# Patient Record
Sex: Female | Born: 1945 | Race: White | Hispanic: No | Marital: Married | Smoking: Current some day smoker
Health system: Southern US, Community
[De-identification: ages and names within clinical notes are randomized; demographics above are authoritative.]

## PROBLEM LIST (undated history)

## (undated) DIAGNOSIS — I10 Essential (primary) hypertension: Secondary | ICD-10-CM

## (undated) DIAGNOSIS — J449 Chronic obstructive pulmonary disease, unspecified: Secondary | ICD-10-CM

## (undated) HISTORY — PX: TUBAL LIGATION: SHX77

---

## 2011-04-10 ENCOUNTER — Emergency Department (HOSPITAL_BASED_OUTPATIENT_CLINIC_OR_DEPARTMENT_OTHER)
Admission: EM | Admit: 2011-04-10 | Discharge: 2011-04-10 | Disposition: A | Discharge status: Home / Self Care | Payer: Medicare Other | Attending: Emergency Medicine | Admitting: Emergency Medicine

## 2011-04-10 ENCOUNTER — Emergency Department (INDEPENDENT_AMBULATORY_CARE_PROVIDER_SITE_OTHER): Payer: Medicare Other

## 2011-04-10 ENCOUNTER — Encounter: Payer: Self-pay | Admitting: *Deleted

## 2011-04-10 DIAGNOSIS — R51 Headache: Secondary | ICD-10-CM | POA: Insufficient documentation

## 2011-04-10 DIAGNOSIS — S1093XA Contusion of unspecified part of neck, initial encounter: Secondary | ICD-10-CM

## 2011-04-10 DIAGNOSIS — S0510XA Contusion of eyeball and orbital tissues, unspecified eye, initial encounter: Secondary | ICD-10-CM

## 2011-04-10 DIAGNOSIS — S0010XA Contusion of unspecified eyelid and periocular area, initial encounter: Secondary | ICD-10-CM | POA: Insufficient documentation

## 2011-04-10 DIAGNOSIS — W1809XA Striking against other object with subsequent fall, initial encounter: Secondary | ICD-10-CM | POA: Insufficient documentation

## 2011-04-10 DIAGNOSIS — R220 Localized swelling, mass and lump, head: Secondary | ICD-10-CM

## 2011-04-10 DIAGNOSIS — Y92009 Unspecified place in unspecified non-institutional (private) residence as the place of occurrence of the external cause: Secondary | ICD-10-CM | POA: Insufficient documentation

## 2011-04-10 DIAGNOSIS — I10 Essential (primary) hypertension: Secondary | ICD-10-CM | POA: Insufficient documentation

## 2011-04-10 DIAGNOSIS — W19XXXA Unspecified fall, initial encounter: Secondary | ICD-10-CM

## 2011-04-10 HISTORY — DX: Essential (primary) hypertension: I10

## 2011-04-10 NOTE — ED Notes (Signed)
Patient states that she took [place] to sleep but got up to get something to eat and tripped over some pillows and fell into a door, hitting L side of face, dazed but did no LOC, swelling and bruised around L eye

## 2011-04-10 NOTE — ED Provider Notes (Signed)
History     CSN: [id number] Arrival date & time: 04/10/2011  1:29 PM   First MD Initiated Contact with Patient 04/10/11 1339      Chief Complaint  Patient presents with  . Fall    (Consider location/radiation/quality/duration/timing/severity/associated sxs/prior treatment) HPI  Patient states that she took [place] to sleep but got up to get something to eat and tripped over some pillows and fell into a door, hitting L side of face, dazed but did no LOC, swelling and bruised around L eye.  Patient denies double vision.  Has had occasional headache off and on since the event.  Past Medical History  Diagnosis Date  . Hypertension     History reviewed. No pertinent past surgical history.  No family history on file.  History  Substance Use Topics  . Smoking status: Current Some Day Smoker  . Smokeless tobacco: Not on file  . Alcohol Use: Yes    OB History    Grav Para Term Preterm Abortions TAB SAB Ect Mult Living                  Review of Systems Remainder review of systems is negative except where noted in history of present illness Allergies  Macrodantin and Sulfa antibiotics  Home Medications   Current Outpatient Rx  Name Route Sig Dispense Refill  . XANAX PO Oral Take by mouth.      . AMLODIPINE BESYLATE 5 MG PO TABS Oral Take by mouth daily.      . ASPIRIN 81 MG PO TABS Oral Take 81 mg by mouth daily.      Marland Kitchen LOXITANE PO Oral Take by mouth.      . MULTIVITAMINS PO CAPS Oral Take 1 capsule by mouth daily.      Marland Kitchen ZOLOFT PO Oral Take 200 mg by mouth.      . DIOVAN HCT PO Oral Take by mouth.      . AMBIEN PO Oral Take by mouth.        BP 135/85  Pulse 94  Temp(Src) 98.2 F (36.8 C) (Oral)  Resp 20  SpO2 97%  Physical Exam  Nursing note and vitals reviewed. Constitutional: She is oriented to person, place, and time. She appears well-developed and well-nourished. No distress.  HENT:  Head: Normocephalic and atraumatic.  Eyes: Pupils are equal, round,  and reactive to light.       Patient has periorbital ecchymosis and hematoma present extraocular movements intact and normal.  Pupils equal reactive.  No hyphema.  Neck: Normal range of motion.  Cardiovascular: Normal rate and intact distal pulses.   Pulmonary/Chest: No respiratory distress.  Abdominal: Normal appearance. She exhibits no distension.  Musculoskeletal: Normal range of motion.  Neurological: She is alert and oriented to person, place, and time. No cranial nerve deficit.  Skin: Skin is warm and dry. No rash noted.  Psychiatric: She has a normal mood and affect. Her behavior is normal.    ED Course  Procedures (including critical care time)  Labs Reviewed - No data to display No results found.   1. Fall   2. Periorbital contusion       MDM          Nelia Shi, MD 04/10/11 2158

## 2012-06-12 ENCOUNTER — Other Ambulatory Visit: Payer: Self-pay | Admitting: Neurological Surgery

## 2012-06-12 DIAGNOSIS — M549 Dorsalgia, unspecified: Secondary | ICD-10-CM

## 2012-07-01 ENCOUNTER — Emergency Department (HOSPITAL_BASED_OUTPATIENT_CLINIC_OR_DEPARTMENT_OTHER): Payer: Medicare Other

## 2012-07-01 ENCOUNTER — Encounter (HOSPITAL_BASED_OUTPATIENT_CLINIC_OR_DEPARTMENT_OTHER): Payer: Self-pay

## 2012-07-01 ENCOUNTER — Emergency Department (HOSPITAL_BASED_OUTPATIENT_CLINIC_OR_DEPARTMENT_OTHER)
Admission: EM | Admit: 2012-07-01 | Discharge: 2012-07-01 | Disposition: A | Discharge status: Home / Self Care | Payer: Medicare Other | Attending: Emergency Medicine | Admitting: Emergency Medicine

## 2012-07-01 DIAGNOSIS — Y939 Activity, unspecified: Secondary | ICD-10-CM | POA: Insufficient documentation

## 2012-07-01 DIAGNOSIS — S20219A Contusion of unspecified front wall of thorax, initial encounter: Secondary | ICD-10-CM | POA: Insufficient documentation

## 2012-07-01 DIAGNOSIS — W1809XA Striking against other object with subsequent fall, initial encounter: Secondary | ICD-10-CM | POA: Insufficient documentation

## 2012-07-01 DIAGNOSIS — S20212A Contusion of left front wall of thorax, initial encounter: Secondary | ICD-10-CM

## 2012-07-01 DIAGNOSIS — F17200 Nicotine dependence, unspecified, uncomplicated: Secondary | ICD-10-CM | POA: Insufficient documentation

## 2012-07-01 DIAGNOSIS — W010XXA Fall on same level from slipping, tripping and stumbling without subsequent striking against object, initial encounter: Secondary | ICD-10-CM | POA: Insufficient documentation

## 2012-07-01 DIAGNOSIS — Z7982 Long term (current) use of aspirin: Secondary | ICD-10-CM | POA: Insufficient documentation

## 2012-07-01 DIAGNOSIS — IMO0002 Reserved for concepts with insufficient information to code with codable children: Secondary | ICD-10-CM | POA: Insufficient documentation

## 2012-07-01 DIAGNOSIS — Z79899 Other long term (current) drug therapy: Secondary | ICD-10-CM | POA: Insufficient documentation

## 2012-07-01 DIAGNOSIS — Y929 Unspecified place or not applicable: Secondary | ICD-10-CM | POA: Insufficient documentation

## 2012-07-01 DIAGNOSIS — I10 Essential (primary) hypertension: Secondary | ICD-10-CM | POA: Insufficient documentation

## 2012-07-01 MED ORDER — HYDROCODONE-ACETAMINOPHEN 5-325 MG PO TABS: 2.0000 | ORAL_TABLET | ORAL | Status: AC | PRN

## 2012-07-01 NOTE — ED Notes (Signed)
Pt reports she fell Monday resulting in left rib pain and back pain.

## 2012-07-01 NOTE — ED Notes (Signed)
Pt report left rib pain that worsens with a deep breath, palpation and ambulating.  Pt also reports low back pain.

## 2012-07-01 NOTE — ED Notes (Signed)
Patient transported to X-ray via stretcher 

## 2012-07-01 NOTE — ED Notes (Signed)
MD at bedside. 

## 2012-07-01 NOTE — ED Provider Notes (Signed)
History     CSN: [id number]  Arrival date & time 07/01/12  1057   First MD Initiated Contact with Patient 07/01/12 1109      Chief Complaint  Patient presents with  . Fall  . Rib Injury  . Back Pain    (Consider location/radiation/quality/duration/timing/severity/associated sxs/prior treatment) HPI Comments: Patient complains of injury from a fall which occurred 2 days ago. Patient reports that she tripped and fell in her ground, hitting the left side of her ribs on repeat scan. Initially she had pain in the anterior portion of the ribs on the left, but now the pain is coming around into her back with the area as well. She says that she moved in bed last night and felt a pop in the area. Pain has now worsened. She is not short of breath but hurts when she breathes. It also hurts to bend, twist or move at all.  She did not hit her head. There was no loss of consciousness. Patient denies neck pain as well as midline back pain.  Patient is a 67 y.o. female presenting with fall and back pain.  Fall Pertinent negatives include no abdominal pain.  Back Pain Associated symptoms: no abdominal pain     Past Medical History  Diagnosis Date  . Hypertension     Past Surgical History  Procedure Laterality Date  . Tubal ligation      No family history on file.  History  Substance Use Topics  . Smoking status: Current Some Day Smoker  . Smokeless tobacco: Not on file  . Alcohol Use: Yes     Comment: 2 glasses of wine daily    OB History   Grav Para Term Preterm Abortions TAB SAB Ect Mult Living                  Review of Systems  Respiratory: Negative for shortness of breath.   Gastrointestinal: Negative for abdominal pain.  Musculoskeletal: Positive for back pain.  All other systems reviewed and are negative.    Allergies  Macrodantin and Sulfa antibiotics  Home Medications   Current Outpatient Rx  Name  Route  Sig  Dispense  Refill  . ALPRAZolam (XANAX PO)  Oral   Take by mouth.           Marland Kitchen amLODipine (NORVASC) 5 MG tablet   Oral   Take by mouth daily.           Marland Kitchen aspirin 81 MG tablet   Oral   Take 81 mg by mouth daily.           . Loxapine Succinate (LOXITANE PO)   Oral   Take by mouth.           . Multiple Vitamin (MULTIVITAMIN) capsule   Oral   Take 1 capsule by mouth daily.           . Sertraline HCl (ZOLOFT PO)   Oral   Take 200 mg by mouth.           . Valsartan-Hydrochlorothiazide (DIOVAN HCT PO)   Oral   Take by mouth.           . Zolpidem Tartrate (AMBIEN PO)   Oral   Take by mouth.             BP 144/86  Pulse 69  Temp(Src) 98.4 F (36.9 C) (Oral)  Resp 16  Ht 5\' 1"  (1.549 m)  Wt 145 lb (65.772 kg)  BMI  27.41 kg/m2  SpO2 100%  Physical Exam  Constitutional: She is oriented to person, place, and time. She appears well-developed and well-nourished. No distress.  HENT:  Head: Normocephalic and atraumatic.  Right Ear: Hearing normal.  Nose: Nose normal.  Mouth/Throat: Oropharynx is clear and moist and mucous membranes are normal.  Eyes: Conjunctivae and EOM are normal. Pupils are equal, round, and reactive to light.  Neck: Normal range of motion. Neck supple.  Cardiovascular: Normal rate, regular rhythm, S1 normal and S2 normal.  Exam reveals no gallop and no friction rub.   No murmur heard. Pulmonary/Chest: Effort normal and breath sounds normal. No respiratory distress. She exhibits no tenderness.    Abdominal: Soft. Normal appearance and bowel sounds are normal. There is no hepatosplenomegaly. There is no tenderness. There is no rebound, no guarding, no tenderness at McBurney's point and negative Murphy's sign. No hernia.  Musculoskeletal: Normal range of motion.       Thoracic back: She exhibits no tenderness.       Lumbar back: She exhibits no tenderness.  Neurological: She is alert and oriented to person, place, and time. She has normal strength. No cranial nerve deficit or sensory  deficit. Coordination normal. GCS eye subscore is 4. GCS verbal subscore is 5. GCS motor subscore is 6.  Skin: Skin is warm, dry and intact. No rash noted. No cyanosis.  Psychiatric: She has a normal mood and affect. Her speech is normal and behavior is normal. Thought content normal.    ED Course  Procedures (including critical care time)  Labs Reviewed - No data to display Dg Ribs Unilateral W/chest Left  07/01/2012  *RADIOLOGY REPORT*  Clinical Data: Fall with pain and bruising to posterior left ribs.  LEFT RIBS AND CHEST - 3+ VIEW  Comparison: None.  Findings: Frontal view of the chest shows midline trachea and normal heart size.  Biapical pleural thickening.  Lungs are clear. No pleural fluid.  Dedicated views of the left ribs show no acute fracture.  IMPRESSION: No acute findings.   Original Report Authenticated By: Leanna Battles, M.D.      Diagnosis: Chest wall contusion    MDM  Patient presents to the ER for evaluation of left-sided rib pain after a fall. Patient has tenderness without crepitance over the left anterior lateral mid and lower rib region. There is no left upper quadrant abdominal tenderness, however. Abdominal exam is benign, no concern for solid organ injury. X-ray does not show any acute fracture and there no lung findings. Patient counseled to take deep breaths, treat her pain as needed. Return to the ER she has difficulty breathing, fever productive cough.        Gilda Crease, MD 07/01/12 [phone]

## 2015-02-27 ENCOUNTER — Emergency Department (HOSPITAL_BASED_OUTPATIENT_CLINIC_OR_DEPARTMENT_OTHER): Payer: Medicare Other

## 2015-02-27 ENCOUNTER — Encounter (HOSPITAL_BASED_OUTPATIENT_CLINIC_OR_DEPARTMENT_OTHER): Payer: Self-pay | Admitting: *Deleted

## 2015-02-27 ENCOUNTER — Emergency Department (HOSPITAL_BASED_OUTPATIENT_CLINIC_OR_DEPARTMENT_OTHER)
Admission: EM | Admit: 2015-02-27 | Discharge: 2015-02-27 | Disposition: A | Discharge status: Home / Self Care | Payer: Medicare Other | Attending: Emergency Medicine | Admitting: Emergency Medicine

## 2015-02-27 DIAGNOSIS — M7989 Other specified soft tissue disorders: Secondary | ICD-10-CM | POA: Diagnosis not present

## 2015-02-27 DIAGNOSIS — M25475 Effusion, left foot: Secondary | ICD-10-CM

## 2015-02-27 DIAGNOSIS — Z720 Tobacco use: Secondary | ICD-10-CM | POA: Insufficient documentation

## 2015-02-27 DIAGNOSIS — I10 Essential (primary) hypertension: Secondary | ICD-10-CM | POA: Diagnosis not present

## 2015-02-27 DIAGNOSIS — Z7982 Long term (current) use of aspirin: Secondary | ICD-10-CM | POA: Insufficient documentation

## 2015-02-27 DIAGNOSIS — M79672 Pain in left foot: Secondary | ICD-10-CM | POA: Diagnosis present

## 2015-02-27 MED ORDER — IBUPROFEN 800 MG PO TABS: 800.0000 mg | ORAL_TABLET | Freq: Three times a day (TID) | ORAL | Status: AC

## 2015-02-27 NOTE — ED Notes (Signed)
Pain and swelling to her left foot x 2 weeks. No known injury.

## 2015-02-27 NOTE — ED Notes (Signed)
PA at bedside.

## 2015-02-27 NOTE — ED Provider Notes (Signed)
CSN: [id number]     Arrival date & time 02/27/15  1119 History   First MD Initiated Contact with Patient 02/27/15 1210     Chief Complaint  Patient presents with  . Foot Pain     (Consider location/radiation/quality/duration/timing/severity/associated sxs/prior Treatment) Patient is a 70 y.o. female presenting with lower extremity pain. The history is provided by the patient and medical records. No language interpreter was used.  Foot Pain Associated symptoms include arthralgias and joint swelling. Pertinent negatives include no chills, fever, nausea, neck pain, numbness or vomiting.     Crystal Chen is a 69 y.o. female  with a hx of hypertension presents to the Emergency Department complaining of gradual, persistent, progressively worsening left forefoot swelling onset 2 weeks ago with associated pain in the MTP for the same amount of time.  Patient denies falls or known injury. She denies swelling of the right leg. She denies progression of the swelling of the left leg. She denies recent travel, P reason immobilization, history of DVT, recent surgeries or fractures. Patient also denies estrogen usage.  She has attempted no treatment for her pain.  No erythema of the site, no fevers, chills, nausea or vomiting. Nothing makes it better and walking on it makes it worse.    Past Medical History  Diagnosis Date  . Hypertension    Past Surgical History  Procedure Laterality Date  . Tubal ligation     No family history on file. Social History  Substance Use Topics  . Smoking status: Current Some Day Smoker -- 1.00 packs/day    Types: Cigarettes  . Smokeless tobacco: None  . Alcohol Use: Yes     Comment: 2 glasses of wine daily   OB History    No data available     Review of Systems  Constitutional: Negative for fever and chills.  Gastrointestinal: Negative for nausea and vomiting.  Musculoskeletal: Positive for joint swelling and arthralgias. Negative for back pain, neck pain  and neck stiffness.  Skin: Negative for wound.  Neurological: Negative for numbness.  Hematological: Does not bruise/bleed easily.  Psychiatric/Behavioral: The patient is not nervous/anxious.   All other systems reviewed and are negative.     Allergies  Macrodantin and Sulfa antibiotics  Home Medications   Prior to Admission medications   Medication Sig Start Date End Date Taking? Authorizing Provider  ATORVASTATIN CALCIUM PO Take by mouth.   Yes Historical Provider, MD  ALPRAZolam (XANAX PO) Take by mouth.      Historical Provider, MD  amLODipine (NORVASC) 5 MG tablet Take by mouth daily.      Historical Provider, MD  aspirin 81 MG tablet Take 81 mg by mouth daily.      Historical Provider, MD  HYDROcodone-acetaminophen (NORCO/VICODIN) 5-325 MG per tablet Take 2 tablets by mouth every 4 (four) hours as needed for pain. 07/01/12   Orpah Greek, MD  ibuprofen (ADVIL,MOTRIN) 800 MG tablet Take 1 tablet (800 mg total) by mouth 3 (three) times daily. 02/27/15   Katerine Morua, PA-C  Loxapine Succinate (LOXITANE PO) Take by mouth.      Historical Provider, MD  METOPROLOL TARTRATE PO Take by mouth.    Historical Provider, MD  Multiple Vitamin (MULTIVITAMIN) capsule Take 1 capsule by mouth daily.      Historical Provider, MD  Sertraline HCl (ZOLOFT PO) Take 200 mg by mouth.      Historical Provider, MD  Zolpidem Tartrate (AMBIEN PO) Take by mouth.      Historical  Provider, MD   BP 129/69 mmHg  Pulse 55  Temp(Src) 98.3 F (36.8 C) (Oral)  Resp 20  Ht 5\' 1"  (1.549 m)  Wt 150 lb (68.04 kg)  BMI 28.36 kg/m2  SpO2 98% Physical Exam  Constitutional: She appears well-developed and well-nourished. No distress.  Awake, alert, nontoxic appearance  HENT:  Head: Normocephalic and atraumatic.  Mouth/Throat: Oropharynx is clear and moist. No oropharyngeal exudate.  Eyes: Conjunctivae are normal. No scleral icterus.  Neck: Normal range of motion. Neck supple.  Cardiovascular:  Normal rate, regular rhythm, normal heart sounds and intact distal pulses.   No murmur heard. Capillary refill < 3 sec No tachycardia  Pulmonary/Chest: Effort normal and breath sounds normal. No respiratory distress. She has no wheezes.  Clear and equal breath sounds  Abdominal: Soft. Bowel sounds are normal. She exhibits no mass. There is no tenderness. There is no rebound and no guarding.  Musculoskeletal: Normal range of motion. She exhibits tenderness. She exhibits no edema.  Mild edema of the left dorsal forefoot without extension to the medial or lateral side or plantar side of the foot Minimal swelling extending proximally on the dorsal forefoot No pitting edema, no edema of the proximal ankle or distal calf No tenderness to palpation of the calf, negative Homans sign, no palpable cord No erythema, induration or palpable fluctuance Mild tenderness to palpation over the dorsal side of the MTPs of toes 1 through 5 Full range of motion of all toes of the left foot, full range of motion of the left ankle and knee without pain  Neurological: She is alert. Coordination normal.  Sensation intact to dull and sharp throughout the entirety of the left extremity Strength 5/5 including dorsiflexion and plantar flexion of the left extremity  Skin: Skin is warm and dry. She is not diaphoretic.  No tenting of the skin  Psychiatric: She has a normal mood and affect.  Nursing note and vitals reviewed.   ED Course  Procedures (including critical care time) Labs Review Labs Reviewed - No data to display  Imaging Review US Venous Img Lower Unilateral Left  02/27/2015  CLINICAL DATA:  Dorsal left foot pain and swelling for the past 2 weeks. No known injury. EXAM: LEFT LOWER EXTREMITY VENOUS DOPPLER ULTRASOUND TECHNIQUE: Gray-scale sonography with graded compression, as well as color Doppler and duplex ultrasound were performed to evaluate the lower extremity deep venous systems from the level of  the common femoral vein and including the common femoral, femoral, profunda femoral, popliteal and calf veins including the posterior tibial, peroneal and gastrocnemius veins when visible. The superficial great saphenous vein was also interrogated. Spectral Doppler was utilized to evaluate flow at rest and with distal augmentation maneuvers in the common femoral, femoral and popliteal veins. COMPARISON:  None. FINDINGS: Contralateral Common Femoral Vein: Respiratory phasicity is normal and symmetric with the symptomatic side. No evidence of thrombus. Normal compressibility. Common Femoral Vein: No evidence of thrombus. Normal compressibility, respiratory phasicity and response to augmentation. Saphenofemoral Junction: No evidence of thrombus. Normal compressibility and flow on color Doppler imaging. Profunda Femoral Vein: No evidence of thrombus. Normal compressibility and flow on color Doppler imaging. Femoral Vein: No evidence of thrombus. Normal compressibility, respiratory phasicity and response to augmentation. Popliteal Vein: No evidence of thrombus. Normal compressibility, respiratory phasicity and response to augmentation. Calf Veins: No evidence of thrombus. Normal compressibility and flow on color Doppler imaging. Superficial Great Saphenous Vein: No evidence of thrombus. Normal compressibility and flow on color Doppler imaging.  Venous Reflux:  None. Other Findings:  None. IMPRESSION: No evidence of deep venous thrombosis. Electronically Signed   By: Claudie Revering M.D.   On: 02/27/2015 14:40   Dg Foot Complete Left  02/27/2015  CLINICAL DATA:  Anterior left foot pain and swelling for 2 weeks. No known injury. Initial encounter. EXAM: LEFT FOOT - COMPLETE 3+ VIEW COMPARISON:  None. FINDINGS: There is no evidence of fracture or dislocation. There is no evidence of arthropathy or other focal bone abnormality. Soft tissues are unremarkable. IMPRESSION: Negative exam. Electronically Signed   By: Inge Rise M.D.   On: 02/27/2015 12:11   I have personally reviewed and evaluated these images and lab results as part of my medical decision-making.   EKG Interpretation None      MDM   Final diagnoses:  Swelling of foot joint, left  Foot swelling   Crystal Chen presents with mild swelling to the left foot 2 weeks without known injury. X-ray without evidence of acute abnormality. Patient is low risk for DVT. She has no history of CHF, no murmur or abnormal lung sounds on today's exam. No chest pain or shortness of breath.  Exam is not consistent with gout or septic joint.  No evidence of cellulitis.  2:45 PM venous duplex without evidence of DVT. Unknown reason for patient's foot swelling. She is to follow-up with her primary care physician within the next 3 days for further evaluation. Patient placed in a postop shoe for comfort. Ibuprofen given for pain control.  The patient was discussed with and seen by Dr. Dayna Barker who agrees with the treatment plan.   Abigail Butts, PA-C 02/27/15 1810  Merrily Pew, MD 02/28/15 [phone]

## 2015-02-27 NOTE — Discharge Instructions (Signed)
1. Medications: alternate tylenol and tylenol for pain control, usual home medications 2. Treatment: rest, ice, elevate and use brace, drink plenty of fluids, gentle stretching 3. Follow Up: Please followup with orthopedics as directed or your PCP in 1 week if no improvement for discussion of your diagnoses and further evaluation after today's visit; if you do not have a primary care doctor use the resource guide provided to find one; Please return to the ER for worsening symptoms or other concerns

## 2015-02-27 NOTE — ED Notes (Signed)
PA-Crystal Chen--put order in wrong place for venous

## 2015-02-27 NOTE — ED Notes (Signed)
Pt directed to pharmacy to pick up medications 

## 2016-05-29 DIAGNOSIS — F339 Major depressive disorder, recurrent, unspecified: Secondary | ICD-10-CM | POA: Diagnosis not present

## 2016-05-29 DIAGNOSIS — I10 Essential (primary) hypertension: Secondary | ICD-10-CM | POA: Diagnosis not present

## 2016-05-29 DIAGNOSIS — E7800 Pure hypercholesterolemia, unspecified: Secondary | ICD-10-CM | POA: Diagnosis not present

## 2016-05-29 DIAGNOSIS — Z23 Encounter for immunization: Secondary | ICD-10-CM | POA: Diagnosis not present

## 2016-06-18 DIAGNOSIS — F411 Generalized anxiety disorder: Secondary | ICD-10-CM | POA: Diagnosis not present

## 2016-06-18 DIAGNOSIS — F330 Major depressive disorder, recurrent, mild: Secondary | ICD-10-CM | POA: Diagnosis not present

## 2016-08-07 DIAGNOSIS — M65312 Trigger thumb, left thumb: Secondary | ICD-10-CM | POA: Diagnosis not present

## 2016-08-07 DIAGNOSIS — M1812 Unilateral primary osteoarthritis of first carpometacarpal joint, left hand: Secondary | ICD-10-CM | POA: Diagnosis not present

## 2016-09-10 DIAGNOSIS — H5203 Hypermetropia, bilateral: Secondary | ICD-10-CM | POA: Diagnosis not present

## 2016-09-10 DIAGNOSIS — H2513 Age-related nuclear cataract, bilateral: Secondary | ICD-10-CM | POA: Diagnosis not present

## 2016-09-11 DIAGNOSIS — Z01818 Encounter for other preprocedural examination: Secondary | ICD-10-CM | POA: Diagnosis not present

## 2016-09-11 DIAGNOSIS — I4519 Other right bundle-branch block: Secondary | ICD-10-CM | POA: Diagnosis not present

## 2016-09-11 DIAGNOSIS — J449 Chronic obstructive pulmonary disease, unspecified: Secondary | ICD-10-CM | POA: Diagnosis not present

## 2016-09-19 DIAGNOSIS — M65312 Trigger thumb, left thumb: Secondary | ICD-10-CM | POA: Diagnosis not present

## 2016-09-20 DIAGNOSIS — M62838 Other muscle spasm: Secondary | ICD-10-CM | POA: Diagnosis not present

## 2016-10-22 DIAGNOSIS — H524 Presbyopia: Secondary | ICD-10-CM | POA: Diagnosis not present

## 2016-10-22 DIAGNOSIS — H52223 Regular astigmatism, bilateral: Secondary | ICD-10-CM | POA: Diagnosis not present

## 2016-10-22 DIAGNOSIS — H5203 Hypermetropia, bilateral: Secondary | ICD-10-CM | POA: Diagnosis not present

## 2016-10-22 DIAGNOSIS — H2513 Age-related nuclear cataract, bilateral: Secondary | ICD-10-CM | POA: Diagnosis not present

## 2016-11-20 DIAGNOSIS — H2512 Age-related nuclear cataract, left eye: Secondary | ICD-10-CM | POA: Diagnosis not present

## 2016-11-20 DIAGNOSIS — H2511 Age-related nuclear cataract, right eye: Secondary | ICD-10-CM | POA: Diagnosis not present

## 2016-11-28 DIAGNOSIS — E7800 Pure hypercholesterolemia, unspecified: Secondary | ICD-10-CM | POA: Diagnosis not present

## 2016-11-28 DIAGNOSIS — I10 Essential (primary) hypertension: Secondary | ICD-10-CM | POA: Diagnosis not present

## 2016-11-28 DIAGNOSIS — F411 Generalized anxiety disorder: Secondary | ICD-10-CM | POA: Diagnosis not present

## 2016-11-28 DIAGNOSIS — F339 Major depressive disorder, recurrent, unspecified: Secondary | ICD-10-CM | POA: Diagnosis not present

## 2016-12-04 DIAGNOSIS — H25812 Combined forms of age-related cataract, left eye: Secondary | ICD-10-CM | POA: Diagnosis not present

## 2016-12-04 DIAGNOSIS — H2512 Age-related nuclear cataract, left eye: Secondary | ICD-10-CM | POA: Diagnosis not present

## 2016-12-11 DIAGNOSIS — F411 Generalized anxiety disorder: Secondary | ICD-10-CM | POA: Diagnosis not present

## 2016-12-11 DIAGNOSIS — F330 Major depressive disorder, recurrent, mild: Secondary | ICD-10-CM | POA: Diagnosis not present

## 2016-12-24 DIAGNOSIS — I10 Essential (primary) hypertension: Secondary | ICD-10-CM | POA: Diagnosis not present

## 2017-01-01 DIAGNOSIS — Z23 Encounter for immunization: Secondary | ICD-10-CM | POA: Diagnosis not present

## 2017-01-01 DIAGNOSIS — K5909 Other constipation: Secondary | ICD-10-CM | POA: Diagnosis not present

## 2017-01-08 DIAGNOSIS — H25811 Combined forms of age-related cataract, right eye: Secondary | ICD-10-CM | POA: Diagnosis not present

## 2017-01-08 DIAGNOSIS — H2511 Age-related nuclear cataract, right eye: Secondary | ICD-10-CM | POA: Diagnosis not present

## 2017-01-27 DIAGNOSIS — H35033 Hypertensive retinopathy, bilateral: Secondary | ICD-10-CM | POA: Diagnosis not present

## 2017-01-27 DIAGNOSIS — D3131 Benign neoplasm of right choroid: Secondary | ICD-10-CM | POA: Diagnosis not present

## 2017-02-20 DIAGNOSIS — Z1231 Encounter for screening mammogram for malignant neoplasm of breast: Secondary | ICD-10-CM | POA: Diagnosis not present

## 2017-02-20 DIAGNOSIS — R928 Other abnormal and inconclusive findings on diagnostic imaging of breast: Secondary | ICD-10-CM | POA: Diagnosis not present

## 2017-02-25 DIAGNOSIS — R928 Other abnormal and inconclusive findings on diagnostic imaging of breast: Secondary | ICD-10-CM | POA: Diagnosis not present

## 2017-05-08 DIAGNOSIS — J019 Acute sinusitis, unspecified: Secondary | ICD-10-CM | POA: Diagnosis not present

## 2017-05-10 ENCOUNTER — Other Ambulatory Visit: Payer: Self-pay

## 2017-05-10 ENCOUNTER — Encounter (HOSPITAL_BASED_OUTPATIENT_CLINIC_OR_DEPARTMENT_OTHER): Payer: Self-pay | Admitting: Emergency Medicine

## 2017-05-10 ENCOUNTER — Emergency Department (HOSPITAL_BASED_OUTPATIENT_CLINIC_OR_DEPARTMENT_OTHER)
Admission: EM | Admit: 2017-05-10 | Discharge: 2017-05-10 | Disposition: A | Discharge status: Home / Self Care | Payer: PPO | Attending: Emergency Medicine | Admitting: Emergency Medicine

## 2017-05-10 ENCOUNTER — Emergency Department (HOSPITAL_BASED_OUTPATIENT_CLINIC_OR_DEPARTMENT_OTHER): Payer: PPO

## 2017-05-10 DIAGNOSIS — J449 Chronic obstructive pulmonary disease, unspecified: Secondary | ICD-10-CM | POA: Insufficient documentation

## 2017-05-10 DIAGNOSIS — J40 Bronchitis, not specified as acute or chronic: Secondary | ICD-10-CM | POA: Diagnosis not present

## 2017-05-10 DIAGNOSIS — F17210 Nicotine dependence, cigarettes, uncomplicated: Secondary | ICD-10-CM | POA: Insufficient documentation

## 2017-05-10 DIAGNOSIS — I10 Essential (primary) hypertension: Secondary | ICD-10-CM | POA: Insufficient documentation

## 2017-05-10 DIAGNOSIS — Z7982 Long term (current) use of aspirin: Secondary | ICD-10-CM | POA: Insufficient documentation

## 2017-05-10 DIAGNOSIS — R062 Wheezing: Secondary | ICD-10-CM | POA: Diagnosis not present

## 2017-05-10 DIAGNOSIS — R05 Cough: Secondary | ICD-10-CM | POA: Diagnosis not present

## 2017-05-10 DIAGNOSIS — Z79899 Other long term (current) drug therapy: Secondary | ICD-10-CM | POA: Diagnosis not present

## 2017-05-10 HISTORY — DX: Chronic obstructive pulmonary disease, unspecified: J44.9

## 2017-05-10 MED ORDER — PREDNISONE 20 MG PO TABS: 40.0000 mg | ORAL_TABLET | Freq: Every day | ORAL | 0 refills | Status: AC

## 2017-05-10 MED ORDER — HYDROCODONE-HOMATROPINE 5-1.5 MG/5ML PO SYRP: 5.0000 mL | ORAL_SOLUTION | Freq: Four times a day (QID) | ORAL | 0 refills | Status: AC | PRN

## 2017-05-10 MED ORDER — ALBUTEROL SULFATE HFA 108 (90 BASE) MCG/ACT IN AERS
2.0000 | INHALATION_SPRAY | RESPIRATORY_TRACT | Status: DC | PRN
  Filled 2017-05-10: qty 6.7

## 2017-05-10 MED ORDER — ALBUTEROL SULFATE (2.5 MG/3ML) 0.083% IN NEBU
5.0000 mg | INHALATION_SOLUTION | Freq: Once | RESPIRATORY_TRACT | Status: AC
  Administered 2017-05-10: 5 mg via RESPIRATORY_TRACT
  Filled 2017-05-10: qty 6

## 2017-05-10 MED ORDER — IPRATROPIUM BROMIDE 0.02 % IN SOLN
0.5000 mg | Freq: Once | RESPIRATORY_TRACT | Status: AC
  Administered 2017-05-10: 0.5 mg via RESPIRATORY_TRACT
  Filled 2017-05-10: qty 2.5

## 2017-05-10 NOTE — ED Notes (Signed)
NAD at this time. Pt is stable and going home.  

## 2017-05-10 NOTE — ED Triage Notes (Addendum)
Cough for 1 week with brown sputum. Pt seen and dx with sinus infection, currently taking amoxicillin and tessalon

## 2017-05-10 NOTE — ED Provider Notes (Signed)
[facility] EMERGENCY DEPARTMENT Provider Note   CSN: [id number] Arrival date & time: 05/10/17  1041     History   Chief Complaint Chief Complaint  Patient presents with  . Cough    HPI Crystal Chen is a 72 y.o. female.  The history is provided by the patient and the spouse.  Cough  This is a new problem. Episode onset: 1 week. The problem occurs constantly. The problem has not changed since onset.The cough is productive of brown sputum. There has been no fever. Associated symptoms include wheezing. Pertinent negatives include no chest pain, no headaches, no rhinorrhea and no shortness of breath. Associated symptoms comments: Sx all worse at night.  Pt did see her doctor 2 days ago and got amoxicillin for a sinus infection and tessalon pearls.  However pt states she is not having any nasal congestion or facial pain.  Also tessalon is not helping with cough.. The treatment provided no relief. She is a smoker. Her past medical history is significant for COPD.    Past Medical History:  Diagnosis Date  . COPD (chronic obstructive pulmonary disease) ([place])   . Hypertension     There are no active problems to display for this patient.   Past Surgical History:  Procedure Laterality Date  . TUBAL LIGATION      OB History    No data available       Home Medications    Prior to Admission medications   Medication Sig Start Date End Date Taking? Authorizing Provider  ALPRAZolam (XANAX PO) Take by mouth.     Yes [provider]  amLODipine (NORVASC) 5 MG tablet Take by mouth daily.     Yes [provider]  aspirin 81 MG tablet Take 81 mg by mouth daily.     Yes [provider]  ATORVASTATIN CALCIUM PO Take by mouth.   Yes [provider]  METOPROLOL TARTRATE PO Take by mouth.   Yes [provider]  Multiple Vitamin (MULTIVITAMIN) capsule Take 1 capsule by mouth daily.     Yes [provider]  Sertraline HCl (ZOLOFT  PO) Take 200 mg by mouth.     Yes [provider]  HYDROcodone-acetaminophen (NORCO/VICODIN) 5-325 MG per tablet Take 2 tablets by mouth every 4 (four) hours as needed for pain. 07/01/12   Orpah Greek, MD  ibuprofen (ADVIL,MOTRIN) 800 MG tablet Take 1 tablet (800 mg total) by mouth 3 (three) times daily. 02/27/15   Muthersbaugh, Jarrett Soho, PA-C  Loxapine Succinate (LOXITANE PO) Take by mouth.      [provider]  Zolpidem Tartrate (AMBIEN PO) Take by mouth.      [provider]    Family History No family history on file.  Social History Social History   Tobacco Use  . Smoking status: Current Some Day Smoker    Packs/day: 1.00    Types: Cigarettes  . Smokeless tobacco: Never Used  Substance Use Topics  . Alcohol use: Yes    Comment: 2 glasses of wine daily  . Drug use: No     Allergies   Macrodantin and Sulfa antibiotics   Review of Systems Review of Systems  HENT: Negative for rhinorrhea.   Respiratory: Positive for cough and wheezing. Negative for shortness of breath.   Cardiovascular: Negative for chest pain.  Neurological: Negative for headaches.  All other systems reviewed and are negative.    Physical Exam Updated Vital Signs BP (!) 142/72 (BP Location: Right  Arm)   Pulse (!) 56   Temp 98.8 F (37.1 C) (Oral)   Resp 20   Ht 5\' 1"  (1.549 m)   Wt 70.3 kg (155 lb)   SpO2 95%   BMI 29.29 kg/m   Physical Exam  Constitutional: She is oriented to person, place, and time. She appears well-developed and well-nourished. No distress.  HENT:  Head: Normocephalic and atraumatic.  Mouth/Throat: Oropharynx is clear and moist.  Eyes: Conjunctivae and EOM are normal. Pupils are equal, round, and reactive to light.  Neck: Normal range of motion. Neck supple.  Cardiovascular: Normal rate, regular rhythm and intact distal pulses.  No murmur heard. Pulmonary/Chest: Effort normal. No respiratory distress. She has wheezes. She has no  rales.  Occasion scant expiratory wheeze in the right lung fields.  Abdominal: Soft. She exhibits no distension. There is no tenderness. There is no rebound and no guarding.  Musculoskeletal: Normal range of motion. She exhibits no edema or tenderness.  Neurological: She is alert and oriented to person, place, and time.  Skin: Skin is warm and dry. No rash noted. No erythema.  Psychiatric: She has a normal mood and affect. Her behavior is normal.  Nursing note and vitals reviewed.    ED Treatments / Results  Labs (all labs ordered are listed, but only abnormal results are displayed) Labs Reviewed - No data to display  EKG  EKG Interpretation None       Radiology Dg Chest 2 View  Result Date: 05/10/2017 CLINICAL DATA:  Patient with cough and congestion. EXAM: CHEST  2 VIEW COMPARISON:  Chest radiograph 09/11/2016. FINDINGS: Stable cardiac and mediastinal contours. No consolidative pulmonary opacities. Biapical pleuroparenchymal thickening. No pleural effusion or pneumothorax. Regional skeleton is unremarkable. IMPRESSION: No acute cardiopulmonary process. Electronically Signed   By: Lovey Newcomer M.D.   On: 05/10/2017 11:18    Procedures Procedures (including critical care time)  Medications Ordered in ED Medications  albuterol (PROVENTIL) (2.5 MG/3ML) 0.083% nebulizer solution 5 mg (5 mg Nebulization Given 05/10/17 1335)  ipratropium (ATROVENT) nebulizer solution 0.5 mg (0.5 mg Nebulization Given 05/10/17 1335)     Initial Impression / Assessment and Plan / ED Course  I have reviewed the triage vital signs and the nursing notes.  Pertinent labs & imaging results that were available during my care of the patient were reviewed by me and considered in my medical decision making (see chart for details).     Pt with symptoms consistent with viral bronchitis.  Well appearing here.  No signs of breathing difficulty but some scant wheezing and coughing during exam.  No signs of  pharyngitis, otitis or abnormal abdominal findings.   CXR wnl and pt to return with any further problems.  Pt given albuterol/atrovent here with improvement.  Will give albuterol inhaler and pt can start prednisone if sx are not improving.  Will have pt d/c amoxicillin.  Given cough syrup to help sleep at night.   Final Clinical Impressions(s) / ED Diagnoses   Final diagnoses:  Bronchitis    ED Discharge Orders        Ordered    HYDROcodone-homatropine (HYCODAN) 5-1.5 MG/5ML syrup  Every 6 hours PRN     05/10/17 1410    predniSONE (DELTASONE) 20 MG tablet  Daily     05/10/17 1410       Blanchie Dessert, MD 05/10/17 1410

## 2017-05-30 DIAGNOSIS — I10 Essential (primary) hypertension: Secondary | ICD-10-CM | POA: Diagnosis not present

## 2017-05-30 DIAGNOSIS — E7800 Pure hypercholesterolemia, unspecified: Secondary | ICD-10-CM | POA: Diagnosis not present

## 2017-08-25 DIAGNOSIS — H10413 Chronic giant papillary conjunctivitis, bilateral: Secondary | ICD-10-CM | POA: Diagnosis not present

## 2017-08-26 DIAGNOSIS — I10 Essential (primary) hypertension: Secondary | ICD-10-CM | POA: Diagnosis not present

## 2017-08-29 DIAGNOSIS — Z961 Presence of intraocular lens: Secondary | ICD-10-CM | POA: Diagnosis not present

## 2017-08-29 DIAGNOSIS — H26493 Other secondary cataract, bilateral: Secondary | ICD-10-CM | POA: Diagnosis not present

## 2017-08-29 DIAGNOSIS — H10413 Chronic giant papillary conjunctivitis, bilateral: Secondary | ICD-10-CM | POA: Diagnosis not present

## 2017-08-29 DIAGNOSIS — D4981 Neoplasm of unspecified behavior of retina and choroid: Secondary | ICD-10-CM | POA: Diagnosis not present

## 2017-09-02 DIAGNOSIS — K635 Polyp of colon: Secondary | ICD-10-CM | POA: Diagnosis not present

## 2017-09-02 DIAGNOSIS — K640 First degree hemorrhoids: Secondary | ICD-10-CM | POA: Diagnosis not present

## 2017-09-02 DIAGNOSIS — K648 Other hemorrhoids: Secondary | ICD-10-CM | POA: Diagnosis not present

## 2017-09-02 DIAGNOSIS — K5730 Diverticulosis of large intestine without perforation or abscess without bleeding: Secondary | ICD-10-CM | POA: Diagnosis not present

## 2017-09-02 DIAGNOSIS — Z1211 Encounter for screening for malignant neoplasm of colon: Secondary | ICD-10-CM | POA: Diagnosis not present

## 2017-09-02 DIAGNOSIS — D127 Benign neoplasm of rectosigmoid junction: Secondary | ICD-10-CM | POA: Diagnosis not present

## 2017-09-04 DIAGNOSIS — I10 Essential (primary) hypertension: Secondary | ICD-10-CM | POA: Diagnosis not present

## 2017-09-04 DIAGNOSIS — R739 Hyperglycemia, unspecified: Secondary | ICD-10-CM | POA: Diagnosis not present

## 2017-09-04 DIAGNOSIS — E782 Mixed hyperlipidemia: Secondary | ICD-10-CM | POA: Diagnosis not present

## 2017-09-11 DIAGNOSIS — H35033 Hypertensive retinopathy, bilateral: Secondary | ICD-10-CM | POA: Diagnosis not present

## 2017-09-11 DIAGNOSIS — H43813 Vitreous degeneration, bilateral: Secondary | ICD-10-CM | POA: Diagnosis not present

## 2017-09-11 DIAGNOSIS — D3131 Benign neoplasm of right choroid: Secondary | ICD-10-CM | POA: Diagnosis not present

## 2017-11-04 DIAGNOSIS — H02834 Dermatochalasis of left upper eyelid: Secondary | ICD-10-CM | POA: Diagnosis not present

## 2017-11-04 DIAGNOSIS — H02831 Dermatochalasis of right upper eyelid: Secondary | ICD-10-CM | POA: Diagnosis not present

## 2017-11-04 DIAGNOSIS — H02832 Dermatochalasis of right lower eyelid: Secondary | ICD-10-CM | POA: Diagnosis not present

## 2017-11-04 DIAGNOSIS — H02835 Dermatochalasis of left lower eyelid: Secondary | ICD-10-CM | POA: Diagnosis not present

## 2017-12-02 DIAGNOSIS — F330 Major depressive disorder, recurrent, mild: Secondary | ICD-10-CM | POA: Diagnosis not present

## 2017-12-02 DIAGNOSIS — F411 Generalized anxiety disorder: Secondary | ICD-10-CM | POA: Diagnosis not present

## 2018-01-18 ENCOUNTER — Emergency Department (HOSPITAL_BASED_OUTPATIENT_CLINIC_OR_DEPARTMENT_OTHER)
Admission: EM | Admit: 2018-01-18 | Discharge: 2018-01-18 | Disposition: A | Discharge status: Home / Self Care | Payer: PPO | Attending: Emergency Medicine | Admitting: Emergency Medicine

## 2018-01-18 ENCOUNTER — Emergency Department (HOSPITAL_COMMUNITY): Payer: PPO

## 2018-01-18 ENCOUNTER — Other Ambulatory Visit: Payer: Self-pay

## 2018-01-18 ENCOUNTER — Encounter (HOSPITAL_BASED_OUTPATIENT_CLINIC_OR_DEPARTMENT_OTHER): Payer: Self-pay | Admitting: Emergency Medicine

## 2018-01-18 DIAGNOSIS — M5441 Lumbago with sciatica, right side: Secondary | ICD-10-CM | POA: Insufficient documentation

## 2018-01-18 DIAGNOSIS — Z79899 Other long term (current) drug therapy: Secondary | ICD-10-CM | POA: Diagnosis not present

## 2018-01-18 DIAGNOSIS — J449 Chronic obstructive pulmonary disease, unspecified: Secondary | ICD-10-CM | POA: Diagnosis not present

## 2018-01-18 DIAGNOSIS — F17210 Nicotine dependence, cigarettes, uncomplicated: Secondary | ICD-10-CM | POA: Insufficient documentation

## 2018-01-18 DIAGNOSIS — M545 Low back pain: Secondary | ICD-10-CM | POA: Diagnosis not present

## 2018-01-18 DIAGNOSIS — M5442 Lumbago with sciatica, left side: Secondary | ICD-10-CM | POA: Diagnosis not present

## 2018-01-18 DIAGNOSIS — I10 Essential (primary) hypertension: Secondary | ICD-10-CM | POA: Insufficient documentation

## 2018-01-18 LAB — BASIC METABOLIC PANEL
ANION GAP: 12 (ref 5–15)
BUN: 19 mg/dL (ref 8–23)
CALCIUM: 9.7 mg/dL (ref 8.9–10.3)
CO2: 28 mmol/L (ref 22–32)
Chloride: 99 mmol/L (ref 98–111)
Creatinine, Ser: 0.7 mg/dL (ref 0.44–1.00)
Glucose, Bld: 105 mg/dL — ABNORMAL HIGH (ref 70–99)
POTASSIUM: 3 mmol/L — AB (ref 3.5–5.1)
Sodium: 139 mmol/L (ref 135–145)

## 2018-01-18 LAB — CBC
HEMATOCRIT: 45.2 % (ref 36.0–46.0)
Hemoglobin: 15.5 g/dL — ABNORMAL HIGH (ref 12.0–15.0)
MCH: 29.9 pg (ref 26.0–34.0)
MCHC: 34.3 g/dL (ref 30.0–36.0)
MCV: 87.1 fL (ref 78.0–100.0)
Platelets: 245 10*3/uL (ref 150–400)
RBC: 5.19 MIL/uL — ABNORMAL HIGH (ref 3.87–5.11)
RDW: 13.4 % (ref 11.5–15.5)
WBC: 8.2 10*3/uL (ref 4.0–10.5)

## 2018-01-18 MED ORDER — METHOCARBAMOL 500 MG PO TABS: 500.0000 mg | ORAL_TABLET | Freq: Every evening | ORAL | 0 refills | Status: AC | PRN

## 2018-01-18 MED ORDER — KETOROLAC TROMETHAMINE 15 MG/ML IJ SOLN
15.0000 mg | Freq: Once | INTRAMUSCULAR | Status: AC
  Administered 2018-01-18: 15 mg via INTRAVENOUS
  Filled 2018-01-18: qty 1

## 2018-01-18 MED ORDER — METHOCARBAMOL 500 MG PO TABS
500.0000 mg | ORAL_TABLET | Freq: Once | ORAL | Status: AC
  Administered 2018-01-18: 500 mg via ORAL
  Filled 2018-01-18: qty 1

## 2018-01-18 MED ORDER — LIDOCAINE 5 % EX PTCH: 1.0000 | MEDICATED_PATCH | CUTANEOUS | 0 refills | Status: AC

## 2018-01-18 NOTE — Discharge Instructions (Addendum)
Take ibuprofen 3 times a day with meals. Take 600-800 mg (3-4 pills) at a time. Do not take other anti-inflammatories at the same time (Advil, Motrin, ibuprofen, Aleve). You may supplement with Tylenol if you need further pain control. Use Robaxin as needed for muscle stiffness or soreness. Have caution, as this may make you tired or groggy. Do not drive or operate heavy machinery while taking this medication.  Use lidocaine patches as needed for pain.  Use muscle creams (bengay, icy hot, salonpas) as needed for pain.  Follow up with the back doctor listed below as needed if pain is persistent with with any new symptoms. Return to the ER if you develop high fevers, loss of bowel or bladder control, difficulty standing due to weakness, or with any new or concerning symptoms.

## 2018-01-18 NOTE — ED Notes (Signed)
Pt at MRI

## 2018-01-18 NOTE — ED Provider Notes (Signed)
  Physical Exam  BP 137/73 (BP Location: Left Arm)   Pulse 66   Temp 97.9 F (36.6 C) (Oral)   Resp 18   Ht 5' (1.524 m)   Wt 69.1 kg   SpO2 100%   BMI 29.75 kg/m   Physical Exam   Gen: Appears in no distress. Neuro/msk: Strength of lower extremity is intact bilaterally.  Patient able to flex and extend at the hip and knee against resistance.  No numbness of the medial or lateral thighs.  Patellar reflexes intact bilaterally.  Pedal pulses intact bilaterally.  No swelling, warmth, or deformities.  Patient with sensation of feet, reports feels decreased, but can feel light touch.  Decreased sensation of the left side when compared to the right.  Full active range of motion of the ankles without difficulty.  ED Course/Procedures      MDM   Pt transferred from [facility] where she saw Dr. Rogene Houston for back pain.  Please see previous notes for further history.  In brief, patient with a 3-day history of back pain.  Pain began when she laid down 3 nights ago, no fall, trauma, or injury.  Pain radiates down the back of her legs.  She has associated bilateral foot numbness.  She denies loss of bowel or bladder control.  She denies numbness or tingling of her legs.  She is ambulatory with pain.  History of sciatica, states this feels different.  Denies history of cancer, denies recent fevers, chills, rash, or history of IV drug use.  MRI pending.  MRI shows L3-4 facet arthropathy with gaping, fluid-filled facet joints.  Concern for compression of the left L3 nerve.  Additionally, she has chronic arthropathy of L4-L5.  As there is concern for compression, will discuss with neurosurgery, although exam is reassuring, as L3-innervated muscles and sensation is intact.   Discussed with Dr. Christella Noa, who agrees that imaging and exam are not clinically correlated.  Does not believe patient's symptoms are due to this possible compression.  Does not need admission or surgery at this time.  Can  follow-up outpatient as needed.  Discussed findings with patient.  Discussed back pain and symptoms may be due to chronic back pain/sciatica, of which patient has a history.  Will treat with NSAIDs, muscle relaxers, lidocaine patch.  Patient to follow-up with neurosurgery as needed.  At this time, patient appears safe for discharge.  Return precautions given.  Patient states she understands and agrees plan.      Franchot Heidelberg, PA-C 01/18/18 2214    Lennice Sites, DO 01/18/18 2343

## 2018-01-18 NOTE — ED Provider Notes (Signed)
[facility] EMERGENCY DEPARTMENT Provider Note   CSN: [id number] Arrival date & time: 01/18/18  1242     History   Chief Complaint Chief Complaint  Patient presents with  . Back Pain    hip pain    HPI Crystal Chen is a 72 y.o. female.  Patient with a 3-day complaint of bilateral lower back pain with radiation of pain down the back of both legs and numbness on the bottom of both feet.  This does wax and wane.  The numbness has persisted but the pain currently as she sits in the gurney does not radiate down both legs.  Has a little bit of numbing feeling down the tops of her toes as well.  No urinary incontinence or difficulty urinating.  No bowel difficulties.  Patient has no history of injury.  She did have some preceding symptoms of some discomfort in the low back prior to the development of the pain radiating down the legs.  In addition patient states that the whole lower half of her body just feels heavy.  But denies any specific weakness.     Past Medical History:  Diagnosis Date  . COPD (chronic obstructive pulmonary disease) ([place])   . Hypertension     There are no active problems to display for this patient.   Past Surgical History:  Procedure Laterality Date  . TUBAL LIGATION       OB History   None      Home Medications    Prior to Admission medications   Medication Sig Start Date End Date Taking? Authorizing Provider  ALPRAZolam (XANAX PO) Take by mouth.     Yes [provider]  amLODipine (NORVASC) 5 MG tablet Take by mouth daily.     Yes [provider]  aspirin 81 MG tablet Take 81 mg by mouth daily.     Yes [provider]  ATORVASTATIN CALCIUM PO Take by mouth.   Yes [provider]  METOPROLOL TARTRATE PO Take by mouth.   Yes [provider]  Multiple Vitamin (MULTIVITAMIN) capsule Take 1 capsule by mouth daily.     Yes [provider]  Sertraline HCl (ZOLOFT PO) Take 200 mg by  mouth.     Yes [provider]  HYDROcodone-acetaminophen (NORCO/VICODIN) 5-325 MG per tablet Take 2 tablets by mouth every 4 (four) hours as needed for pain. 07/01/12   Orpah Greek, MD  HYDROcodone-homatropine (HYCODAN) 5-1.5 MG/5ML syrup Take 5 mLs by mouth every 6 (six) hours as needed for cough. 05/10/17   Blanchie Dessert, MD  ibuprofen (ADVIL,MOTRIN) 800 MG tablet Take 1 tablet (800 mg total) by mouth 3 (three) times daily. 02/27/15   Muthersbaugh, Jarrett Soho, PA-C  Loxapine Succinate (LOXITANE PO) Take by mouth.      [provider]  predniSONE (DELTASONE) 20 MG tablet Take 2 tablets (40 mg total) by mouth daily. 05/10/17   Blanchie Dessert, MD  Zolpidem Tartrate (AMBIEN PO) Take by mouth.      [provider]    Family History No family history on file.  Social History Social History   Tobacco Use  . Smoking status: Current Some Day Smoker    Packs/day: 1.00    Types: Cigarettes  . Smokeless tobacco: Never Used  Substance Use Topics  . Alcohol use: Yes    Comment: 2 glasses of wine daily  . Drug use: No     Allergies   Macrodantin and Sulfa antibiotics   Review  of Systems Review of Systems  Constitutional: Negative for fever.  HENT: Negative for congestion.   Eyes: Negative for visual disturbance.  Respiratory: Negative for shortness of breath.   Cardiovascular: Negative for chest pain.  Gastrointestinal: Negative for abdominal pain, nausea and vomiting.  Genitourinary: Negative for difficulty urinating and dysuria.  Musculoskeletal: Positive for back pain. Negative for neck pain.  Skin: Negative for rash.  Neurological: Positive for numbness.  Hematological: Does not bruise/bleed easily.  Psychiatric/Behavioral: Negative for confusion.     Physical Exam Updated Vital Signs BP (!) 152/76 (BP Location: Left Arm)   Pulse 72   Temp 98.3 F (36.8 C) (Oral)   Resp 18   Ht 1.524 m (5')   Wt 69.1 kg   SpO2 98%   BMI 29.75 kg/m    Physical Exam  Constitutional: She appears well-developed and well-nourished. No distress.  HENT:  Head: Normocephalic and atraumatic.  Mouth/Throat: Oropharynx is clear and moist.  Eyes: Pupils are equal, round, and reactive to light. Conjunctivae and EOM are normal.  Neck: Neck supple.  Cardiovascular: Normal rate, regular rhythm and normal heart sounds.  Pulmonary/Chest: Effort normal and breath sounds normal. No respiratory distress.  Abdominal: Soft. Bowel sounds are normal. There is no tenderness.  Musculoskeletal: She exhibits no edema.  Patient's dorsalis pedis pulses 2+ bilaterally good cap refill to the toes.  Definite decrease sensory feeling to the bottoms of both feet.  And some on the top of the toes.  No obvious motor weakness but patient states that legs feel heavy.  Neurological: A sensory deficit is present. No cranial nerve deficit. She exhibits normal muscle tone. Coordination normal.  Skin: Skin is warm. Capillary refill takes less than 2 seconds. No rash noted.  Nursing note and vitals reviewed.    ED Treatments / Results  Labs (all labs ordered are listed, but only abnormal results are displayed) Labs Reviewed  CBC  BASIC METABOLIC PANEL    EKG None  Radiology No results found.  Procedures Procedures (including critical care time)  Medications Ordered in ED Medications - No data to display   Initial Impression / Assessment and Plan / ED Course  I have reviewed the triage vital signs and the nursing notes.  Pertinent labs & imaging results that were available during my care of the patient were reviewed by me and considered in my medical decision making (see chart for details).    Patient with definite numbness on exam.  To the bottoms of both feet.  Concerns are for possible central herniated disc cauda equina type syndrome although no bowel or bladder dysfunction.  Patient does state that the whole lower part of her body feels heavy suggestive  of a little bit of motor weakness as well but not able to delineate it on exam.  Symptoms are definitely bilateral.  No direct injury.  Patient will be transferred to [facility] for MRI of the lumbar back to rule out any significant findings such as cauda equina syndrome.  Basic labs done here and IV saline lock started.  Patient will be transferred to [facility] ED for the MRI which is already been ordered.  Discussed with the emergency physician Dr. Ronnald Nian.  [facility] will transfer.   Final Clinical Impressions(s) / ED Diagnoses   Final diagnoses:  Acute bilateral low back pain with bilateral sciatica    ED Discharge Orders    None       Fredia Sorrow, MD 01/18/18 1558

## 2018-01-18 NOTE — ED Notes (Signed)
Spoke to [employer], Agricultural consultant at [employer], alerted her of patients transfer

## 2018-01-18 NOTE — ED Triage Notes (Addendum)
For past 3 days has been having lower back pain that radiates to hips and goes down to toes, with numbness to feet. Denies B/B difficulty . Ambulated from waiting room, with steady gait , has not taken any OTC med

## 2018-01-18 NOTE — ED Notes (Signed)
Called to verify pt in line for MRI

## 2018-01-20 DIAGNOSIS — M5416 Radiculopathy, lumbar region: Secondary | ICD-10-CM | POA: Diagnosis not present

## 2018-01-20 DIAGNOSIS — R739 Hyperglycemia, unspecified: Secondary | ICD-10-CM | POA: Diagnosis not present

## 2018-01-20 DIAGNOSIS — E782 Mixed hyperlipidemia: Secondary | ICD-10-CM | POA: Diagnosis not present

## 2018-01-20 DIAGNOSIS — I10 Essential (primary) hypertension: Secondary | ICD-10-CM | POA: Diagnosis not present

## 2018-01-20 DIAGNOSIS — R202 Paresthesia of skin: Secondary | ICD-10-CM | POA: Diagnosis not present

## 2018-01-20 DIAGNOSIS — Z23 Encounter for immunization: Secondary | ICD-10-CM | POA: Diagnosis not present

## 2018-01-20 DIAGNOSIS — R2681 Unsteadiness on feet: Secondary | ICD-10-CM | POA: Diagnosis not present

## 2018-01-21 DIAGNOSIS — Z7952 Long term (current) use of systemic steroids: Secondary | ICD-10-CM | POA: Diagnosis not present

## 2018-01-21 DIAGNOSIS — G9529 Other cord compression: Secondary | ICD-10-CM | POA: Diagnosis not present

## 2018-01-21 DIAGNOSIS — M5416 Radiculopathy, lumbar region: Secondary | ICD-10-CM | POA: Diagnosis not present

## 2018-01-21 DIAGNOSIS — M4726 Other spondylosis with radiculopathy, lumbar region: Secondary | ICD-10-CM | POA: Diagnosis not present

## 2018-01-21 DIAGNOSIS — G629 Polyneuropathy, unspecified: Secondary | ICD-10-CM | POA: Diagnosis not present

## 2018-01-21 DIAGNOSIS — M4306 Spondylolysis, lumbar region: Secondary | ICD-10-CM | POA: Diagnosis not present

## 2018-01-21 DIAGNOSIS — M5441 Lumbago with sciatica, right side: Secondary | ICD-10-CM | POA: Diagnosis not present

## 2018-01-21 DIAGNOSIS — R200 Anesthesia of skin: Secondary | ICD-10-CM | POA: Diagnosis not present

## 2018-01-21 DIAGNOSIS — R262 Difficulty in walking, not elsewhere classified: Secondary | ICD-10-CM | POA: Diagnosis not present

## 2018-01-21 DIAGNOSIS — M5442 Lumbago with sciatica, left side: Secondary | ICD-10-CM | POA: Diagnosis not present

## 2018-01-28 DIAGNOSIS — H02834 Dermatochalasis of left upper eyelid: Secondary | ICD-10-CM | POA: Diagnosis not present

## 2018-01-28 DIAGNOSIS — H02831 Dermatochalasis of right upper eyelid: Secondary | ICD-10-CM | POA: Diagnosis not present

## 2018-02-02 DIAGNOSIS — M5416 Radiculopathy, lumbar region: Secondary | ICD-10-CM | POA: Diagnosis not present

## 2018-02-17 DIAGNOSIS — M8580 Other specified disorders of bone density and structure, unspecified site: Secondary | ICD-10-CM | POA: Diagnosis not present

## 2018-02-17 DIAGNOSIS — Z882 Allergy status to sulfonamides status: Secondary | ICD-10-CM | POA: Diagnosis not present

## 2018-02-17 DIAGNOSIS — M4316 Spondylolisthesis, lumbar region: Secondary | ICD-10-CM | POA: Diagnosis not present

## 2018-02-17 DIAGNOSIS — M8588 Other specified disorders of bone density and structure, other site: Secondary | ICD-10-CM | POA: Diagnosis not present

## 2018-02-17 DIAGNOSIS — Z888 Allergy status to other drugs, medicaments and biological substances status: Secondary | ICD-10-CM | POA: Diagnosis not present

## 2018-02-24 DIAGNOSIS — Z1239 Encounter for other screening for malignant neoplasm of breast: Secondary | ICD-10-CM | POA: Diagnosis not present

## 2018-02-24 DIAGNOSIS — Z1231 Encounter for screening mammogram for malignant neoplasm of breast: Secondary | ICD-10-CM | POA: Diagnosis not present

## 2018-03-03 DIAGNOSIS — E782 Mixed hyperlipidemia: Secondary | ICD-10-CM | POA: Diagnosis not present

## 2018-03-03 DIAGNOSIS — E2839 Other primary ovarian failure: Secondary | ICD-10-CM | POA: Diagnosis not present

## 2018-03-03 DIAGNOSIS — R739 Hyperglycemia, unspecified: Secondary | ICD-10-CM | POA: Diagnosis not present

## 2018-03-03 DIAGNOSIS — I10 Essential (primary) hypertension: Secondary | ICD-10-CM | POA: Diagnosis not present

## 2018-03-03 DIAGNOSIS — M8580 Other specified disorders of bone density and structure, unspecified site: Secondary | ICD-10-CM | POA: Diagnosis not present

## 2018-03-03 DIAGNOSIS — Z780 Asymptomatic menopausal state: Secondary | ICD-10-CM | POA: Diagnosis not present

## 2018-03-03 DIAGNOSIS — M85852 Other specified disorders of bone density and structure, left thigh: Secondary | ICD-10-CM | POA: Diagnosis not present

## 2018-03-10 DIAGNOSIS — H02834 Dermatochalasis of left upper eyelid: Secondary | ICD-10-CM | POA: Diagnosis not present

## 2018-03-10 DIAGNOSIS — H02831 Dermatochalasis of right upper eyelid: Secondary | ICD-10-CM | POA: Diagnosis not present

## 2018-03-12 DIAGNOSIS — M4316 Spondylolisthesis, lumbar region: Secondary | ICD-10-CM | POA: Diagnosis not present

## 2018-03-12 DIAGNOSIS — Z1211 Encounter for screening for malignant neoplasm of colon: Secondary | ICD-10-CM | POA: Diagnosis not present

## 2018-03-12 DIAGNOSIS — Z0000 Encounter for general adult medical examination without abnormal findings: Secondary | ICD-10-CM | POA: Diagnosis not present

## 2018-04-08 DIAGNOSIS — D4981 Neoplasm of unspecified behavior of retina and choroid: Secondary | ICD-10-CM | POA: Diagnosis not present

## 2018-04-08 DIAGNOSIS — H26493 Other secondary cataract, bilateral: Secondary | ICD-10-CM | POA: Diagnosis not present

## 2018-04-08 DIAGNOSIS — Z961 Presence of intraocular lens: Secondary | ICD-10-CM | POA: Diagnosis not present

## 2018-04-08 DIAGNOSIS — H04123 Dry eye syndrome of bilateral lacrimal glands: Secondary | ICD-10-CM | POA: Diagnosis not present

## 2018-04-15 DIAGNOSIS — M4316 Spondylolisthesis, lumbar region: Secondary | ICD-10-CM | POA: Diagnosis not present

## 2018-04-15 DIAGNOSIS — F411 Generalized anxiety disorder: Secondary | ICD-10-CM | POA: Diagnosis not present

## 2018-04-15 DIAGNOSIS — F17210 Nicotine dependence, cigarettes, uncomplicated: Secondary | ICD-10-CM | POA: Diagnosis not present

## 2018-04-15 DIAGNOSIS — M5442 Lumbago with sciatica, left side: Secondary | ICD-10-CM | POA: Diagnosis not present

## 2018-04-15 DIAGNOSIS — Z9181 History of falling: Secondary | ICD-10-CM | POA: Diagnosis not present

## 2018-04-15 DIAGNOSIS — M48061 Spinal stenosis, lumbar region without neurogenic claudication: Secondary | ICD-10-CM | POA: Diagnosis not present

## 2018-04-15 DIAGNOSIS — I10 Essential (primary) hypertension: Secondary | ICD-10-CM | POA: Diagnosis not present

## 2018-04-15 DIAGNOSIS — F329 Major depressive disorder, single episode, unspecified: Secondary | ICD-10-CM | POA: Diagnosis not present

## 2018-04-15 DIAGNOSIS — E785 Hyperlipidemia, unspecified: Secondary | ICD-10-CM | POA: Diagnosis not present

## 2018-04-15 DIAGNOSIS — J449 Chronic obstructive pulmonary disease, unspecified: Secondary | ICD-10-CM | POA: Diagnosis not present

## 2018-04-15 DIAGNOSIS — M5441 Lumbago with sciatica, right side: Secondary | ICD-10-CM | POA: Diagnosis not present

## 2018-04-19 DIAGNOSIS — M5442 Lumbago with sciatica, left side: Secondary | ICD-10-CM | POA: Diagnosis not present

## 2018-04-22 ENCOUNTER — Other Ambulatory Visit: Payer: Self-pay | Admitting: *Deleted

## 2018-04-22 NOTE — Patient Outreach (Signed)
[facility]) Care Management  04/22/2018  Labella Zahradnik Sep 29, 1945 [id number]   Transition of Care Referral   Referral Date: 04/21/18  Referral Source: [facility] IP discharge  Date of Admission: 04/15/18 Diagnosis: Spondylolisthesis, lumbar region  Spondylolisthesis at L3-L4 level s/p lumbar fusion L3-4  Date of Discharge: on 06/17/17 Facility: [place] attempt # 1 No answer. [facility] RN CM left HIPAA compliant voicemail message along with CM's contact info.   Plan: [facility] RN CM sent an unsuccessful outreach letter and scheduled this patient for another call attempt within 4 business days   Enijah Furr L. Lavina Hamman, RN, BSN, [place] Management Care Coordinator Direct Number [phone] Mobile number [phone]  Main [facility] number [phone] Fax number [phone]

## 2018-04-23 ENCOUNTER — Ambulatory Visit: Payer: Self-pay | Admitting: *Deleted

## 2018-04-27 ENCOUNTER — Other Ambulatory Visit: Payer: Self-pay | Admitting: *Deleted

## 2018-04-27 NOTE — Patient Outreach (Signed)
[facility]) Care Management  04/27/2018  Crystal Chen 1945-12-30 [id number]   Transition of Care Referral  Referral Date: 04/21/18  Referral Source: [facility]TA IP discharge  Date of Admission: 04/15/18 Diagnosis: Spondylolisthesis, lumbar region  Spondylolisthesis at L3-L4 level s/p lumbar fusion L3-4  Date of Discharge: on 06/17/17 Facility: [place]: HTA   Call attempt # 2  No answer. [facility] RN CM left HIPAA compliant voicemail message along with CM's contact info.   Plan: [facility] RN CM scheduled this patient for another call attempt within 4 business days   [place] Carol L. Lavina Hamman, RN, BSN, [place] Coordinator Office number [phone] Mobile number [phone]  Main [facility] number [phone] Fax number [phone]

## 2018-04-30 ENCOUNTER — Other Ambulatory Visit: Payer: Self-pay | Admitting: *Deleted

## 2018-04-30 NOTE — Patient Outreach (Signed)
  [facility]) Care Management  04/30/2018  Tia Hieronymus July 13, 1945 [id number]   Transition of Care Referral  Referral Date:04/21/18 Referral Source:[facility]A IP discharge Date of Admission:04/15/18 Diagnosis:Spondylolisthesis, lumbar region  Spondylolisthesis at L3-L4 levels/p lumbar fusion L3-4 Date of Discharge:on 06/17/17 [facility] Insurance:HTA  Call attempt #3  No answer. [facility] RN CM left HIPAA compliant voicemail message along with CM's contact info.   Plan: [facility] RN CM  scheduled this patient for case closure  within 4 business days  Kimberly L. Lavina Hamman, RN, BSN, [place] Coordinator Office number [phone] Mobile number [phone]  Main [facility] number [phone] Fax number [phone]

## 2018-05-01 NOTE — Patient Outreach (Signed)
[facility]) Care Management  05/01/2018  Crystal Chen [birth year] [id number]   Late entry for 04/27/18 1753  Transition of Care Referral  Referral Date:04/21/18 Referral Source:[facility] IP discharge Date of Admission:04/15/18 Diagnosis:Spondylolisthesis, lumbar region  Spondylolisthesis at L3-L4 levels/p lumbar fusion L3-4 Date of Discharge:on 06/17/17 [facility] Insurance:HTA  Call attempt #2  Outreach attempt # 1 No answer. [facility] RN CM left HIPAA compliant voicemail message along with CM's contact info.   Plan: [facility] RN CM scheduled this patient for another call attempt within 4 business days  Kimberly L. Lavina Hamman, RN, BSN, [place] Coordinator Office number [phone] Mobile number [phone]  Main [facility] number [phone] Fax number [phone]

## 2018-05-18 DIAGNOSIS — M5386 Other specified dorsopathies, lumbar region: Secondary | ICD-10-CM | POA: Diagnosis not present

## 2018-05-18 DIAGNOSIS — Z4789 Encounter for other orthopedic aftercare: Secondary | ICD-10-CM | POA: Diagnosis not present

## 2018-05-18 DIAGNOSIS — M4316 Spondylolisthesis, lumbar region: Secondary | ICD-10-CM | POA: Diagnosis not present

## 2018-05-18 DIAGNOSIS — Z981 Arthrodesis status: Secondary | ICD-10-CM | POA: Diagnosis not present

## 2018-05-18 DIAGNOSIS — R29898 Other symptoms and signs involving the musculoskeletal system: Secondary | ICD-10-CM | POA: Diagnosis not present

## 2018-05-27 DIAGNOSIS — Z981 Arthrodesis status: Secondary | ICD-10-CM | POA: Diagnosis not present

## 2018-05-27 DIAGNOSIS — M545 Low back pain: Secondary | ICD-10-CM | POA: Diagnosis not present

## 2018-05-27 DIAGNOSIS — M259 Joint disorder, unspecified: Secondary | ICD-10-CM | POA: Diagnosis not present

## 2018-05-27 DIAGNOSIS — R531 Weakness: Secondary | ICD-10-CM | POA: Diagnosis not present

## 2018-05-27 DIAGNOSIS — Z7409 Other reduced mobility: Secondary | ICD-10-CM | POA: Diagnosis not present

## 2018-05-27 DIAGNOSIS — R2689 Other abnormalities of gait and mobility: Secondary | ICD-10-CM | POA: Diagnosis not present

## 2018-06-09 DIAGNOSIS — M259 Joint disorder, unspecified: Secondary | ICD-10-CM | POA: Diagnosis not present

## 2018-06-09 DIAGNOSIS — M545 Low back pain: Secondary | ICD-10-CM | POA: Diagnosis not present

## 2018-06-09 DIAGNOSIS — R531 Weakness: Secondary | ICD-10-CM | POA: Diagnosis not present

## 2018-06-09 DIAGNOSIS — R2689 Other abnormalities of gait and mobility: Secondary | ICD-10-CM | POA: Diagnosis not present

## 2018-06-09 DIAGNOSIS — Z7409 Other reduced mobility: Secondary | ICD-10-CM | POA: Diagnosis not present

## 2018-06-09 DIAGNOSIS — Z981 Arthrodesis status: Secondary | ICD-10-CM | POA: Diagnosis not present

## 2018-06-19 DIAGNOSIS — F411 Generalized anxiety disorder: Secondary | ICD-10-CM | POA: Diagnosis not present

## 2018-06-19 DIAGNOSIS — F330 Major depressive disorder, recurrent, mild: Secondary | ICD-10-CM | POA: Diagnosis not present

## 2018-07-13 DIAGNOSIS — I10 Essential (primary) hypertension: Secondary | ICD-10-CM | POA: Diagnosis not present

## 2018-07-13 DIAGNOSIS — E782 Mixed hyperlipidemia: Secondary | ICD-10-CM | POA: Diagnosis not present

## 2018-07-16 DIAGNOSIS — E782 Mixed hyperlipidemia: Secondary | ICD-10-CM | POA: Diagnosis not present

## 2018-07-16 DIAGNOSIS — R7301 Impaired fasting glucose: Secondary | ICD-10-CM | POA: Diagnosis not present

## 2018-07-16 DIAGNOSIS — E669 Obesity, unspecified: Secondary | ICD-10-CM | POA: Diagnosis not present

## 2018-07-16 DIAGNOSIS — I10 Essential (primary) hypertension: Secondary | ICD-10-CM | POA: Diagnosis not present

## 2018-07-21 DIAGNOSIS — M4316 Spondylolisthesis, lumbar region: Secondary | ICD-10-CM | POA: Diagnosis not present

## 2018-07-21 DIAGNOSIS — Z981 Arthrodesis status: Secondary | ICD-10-CM | POA: Diagnosis not present

## 2018-09-03 ENCOUNTER — Other Ambulatory Visit: Payer: Self-pay | Admitting: *Deleted

## 2018-09-03 NOTE — Patient Outreach (Signed)
[facility]) Care Management  09/03/2018  Crystal Chen 1945-11-01 [id number]   Late entry for  Case closure for 05/07/18    Call attempts made x 3 days  Unsuccessful outreach letter sent without a response   Plan  close case after no response from patient within 10 business days. Unable to reach   [place]. Lavina Hamman, RN, BSN, [place] Coordinator Office number [phone] Mobile number [phone]  Main [facility] number [phone] Fax number [phone]

## 2018-09-04 DIAGNOSIS — Z20828 Contact with and (suspected) exposure to other viral communicable diseases: Secondary | ICD-10-CM | POA: Diagnosis not present

## 2018-09-04 DIAGNOSIS — Z720 Tobacco use: Secondary | ICD-10-CM | POA: Diagnosis not present

## 2018-09-04 DIAGNOSIS — I10 Essential (primary) hypertension: Secondary | ICD-10-CM | POA: Diagnosis not present

## 2018-09-21 DIAGNOSIS — M25561 Pain in right knee: Secondary | ICD-10-CM | POA: Diagnosis not present

## 2018-09-21 DIAGNOSIS — S8991XA Unspecified injury of right lower leg, initial encounter: Secondary | ICD-10-CM | POA: Diagnosis not present

## 2018-10-19 DIAGNOSIS — I10 Essential (primary) hypertension: Secondary | ICD-10-CM | POA: Diagnosis not present

## 2018-10-19 DIAGNOSIS — R7301 Impaired fasting glucose: Secondary | ICD-10-CM | POA: Diagnosis not present

## 2018-10-20 DIAGNOSIS — E669 Obesity, unspecified: Secondary | ICD-10-CM | POA: Diagnosis not present

## 2018-10-20 DIAGNOSIS — I10 Essential (primary) hypertension: Secondary | ICD-10-CM | POA: Diagnosis not present

## 2018-10-20 DIAGNOSIS — E7800 Pure hypercholesterolemia, unspecified: Secondary | ICD-10-CM | POA: Diagnosis not present

## 2018-10-20 DIAGNOSIS — F339 Major depressive disorder, recurrent, unspecified: Secondary | ICD-10-CM | POA: Diagnosis not present

## 2018-10-20 DIAGNOSIS — R7301 Impaired fasting glucose: Secondary | ICD-10-CM | POA: Diagnosis not present

## 2018-11-19 DIAGNOSIS — L03213 Periorbital cellulitis: Secondary | ICD-10-CM | POA: Diagnosis not present

## 2018-11-19 DIAGNOSIS — I10 Essential (primary) hypertension: Secondary | ICD-10-CM | POA: Diagnosis not present

## 2018-12-10 DIAGNOSIS — F411 Generalized anxiety disorder: Secondary | ICD-10-CM | POA: Diagnosis not present

## 2018-12-10 DIAGNOSIS — F330 Major depressive disorder, recurrent, mild: Secondary | ICD-10-CM | POA: Diagnosis not present

## 2018-12-11 DIAGNOSIS — R197 Diarrhea, unspecified: Secondary | ICD-10-CM | POA: Diagnosis not present

## 2018-12-11 DIAGNOSIS — Z110 Encounter for screening for intestinal infectious diseases: Secondary | ICD-10-CM | POA: Diagnosis not present

## 2018-12-12 DIAGNOSIS — R197 Diarrhea, unspecified: Secondary | ICD-10-CM | POA: Diagnosis not present

## 2018-12-12 DIAGNOSIS — R05 Cough: Secondary | ICD-10-CM | POA: Diagnosis not present

## 2018-12-12 DIAGNOSIS — Z20828 Contact with and (suspected) exposure to other viral communicable diseases: Secondary | ICD-10-CM | POA: Diagnosis not present

## 2018-12-12 DIAGNOSIS — R432 Parageusia: Secondary | ICD-10-CM | POA: Diagnosis not present

## 2018-12-13 DIAGNOSIS — Z1159 Encounter for screening for other viral diseases: Secondary | ICD-10-CM | POA: Diagnosis not present

## 2018-12-23 DIAGNOSIS — I10 Essential (primary) hypertension: Secondary | ICD-10-CM | POA: Diagnosis not present

## 2018-12-23 DIAGNOSIS — F339 Major depressive disorder, recurrent, unspecified: Secondary | ICD-10-CM | POA: Diagnosis not present

## 2018-12-23 DIAGNOSIS — R0602 Shortness of breath: Secondary | ICD-10-CM | POA: Diagnosis not present

## 2018-12-23 DIAGNOSIS — Z720 Tobacco use: Secondary | ICD-10-CM | POA: Diagnosis not present

## 2018-12-23 DIAGNOSIS — Z9189 Other specified personal risk factors, not elsewhere classified: Secondary | ICD-10-CM | POA: Diagnosis not present

## 2019-01-05 DIAGNOSIS — R0602 Shortness of breath: Secondary | ICD-10-CM | POA: Diagnosis not present

## 2019-01-05 DIAGNOSIS — Z720 Tobacco use: Secondary | ICD-10-CM | POA: Diagnosis not present

## 2019-01-12 DIAGNOSIS — Z1159 Encounter for screening for other viral diseases: Secondary | ICD-10-CM | POA: Diagnosis not present

## 2019-02-09 DIAGNOSIS — F17210 Nicotine dependence, cigarettes, uncomplicated: Secondary | ICD-10-CM | POA: Diagnosis not present

## 2019-03-09 DIAGNOSIS — D4981 Neoplasm of unspecified behavior of retina and choroid: Secondary | ICD-10-CM | POA: Diagnosis not present

## 2019-03-09 DIAGNOSIS — H04123 Dry eye syndrome of bilateral lacrimal glands: Secondary | ICD-10-CM | POA: Diagnosis not present

## 2019-03-09 DIAGNOSIS — Z961 Presence of intraocular lens: Secondary | ICD-10-CM | POA: Diagnosis not present

## 2019-03-09 DIAGNOSIS — H524 Presbyopia: Secondary | ICD-10-CM | POA: Diagnosis not present

## 2019-03-09 DIAGNOSIS — H26493 Other secondary cataract, bilateral: Secondary | ICD-10-CM | POA: Diagnosis not present

## 2019-03-23 DIAGNOSIS — K5900 Constipation, unspecified: Secondary | ICD-10-CM | POA: Diagnosis not present

## 2019-03-23 DIAGNOSIS — F330 Major depressive disorder, recurrent, mild: Secondary | ICD-10-CM | POA: Diagnosis not present

## 2019-03-23 DIAGNOSIS — Z720 Tobacco use: Secondary | ICD-10-CM | POA: Diagnosis not present

## 2019-03-23 DIAGNOSIS — I10 Essential (primary) hypertension: Secondary | ICD-10-CM | POA: Diagnosis not present

## 2019-03-23 DIAGNOSIS — R140 Abdominal distension (gaseous): Secondary | ICD-10-CM | POA: Diagnosis not present

## 2019-03-23 DIAGNOSIS — R7301 Impaired fasting glucose: Secondary | ICD-10-CM | POA: Diagnosis not present

## 2019-03-23 DIAGNOSIS — K760 Fatty (change of) liver, not elsewhere classified: Secondary | ICD-10-CM | POA: Diagnosis not present

## 2019-03-23 DIAGNOSIS — Z0000 Encounter for general adult medical examination without abnormal findings: Secondary | ICD-10-CM | POA: Diagnosis not present

## 2019-03-23 DIAGNOSIS — E669 Obesity, unspecified: Secondary | ICD-10-CM | POA: Diagnosis not present

## 2019-03-23 DIAGNOSIS — F411 Generalized anxiety disorder: Secondary | ICD-10-CM | POA: Diagnosis not present

## 2019-03-23 DIAGNOSIS — I700 Atherosclerosis of aorta: Secondary | ICD-10-CM | POA: Diagnosis not present

## 2019-04-06 DIAGNOSIS — I10 Essential (primary) hypertension: Secondary | ICD-10-CM | POA: Diagnosis not present

## 2019-04-06 DIAGNOSIS — Z720 Tobacco use: Secondary | ICD-10-CM | POA: Diagnosis not present

## 2019-04-06 DIAGNOSIS — Z1159 Encounter for screening for other viral diseases: Secondary | ICD-10-CM | POA: Diagnosis not present

## 2019-04-06 DIAGNOSIS — K5900 Constipation, unspecified: Secondary | ICD-10-CM | POA: Diagnosis not present

## 2019-04-06 DIAGNOSIS — R140 Abdominal distension (gaseous): Secondary | ICD-10-CM | POA: Diagnosis not present

## 2019-04-06 DIAGNOSIS — E669 Obesity, unspecified: Secondary | ICD-10-CM | POA: Diagnosis not present

## 2019-04-06 DIAGNOSIS — R7301 Impaired fasting glucose: Secondary | ICD-10-CM | POA: Diagnosis not present

## 2019-04-15 DIAGNOSIS — Z1231 Encounter for screening mammogram for malignant neoplasm of breast: Secondary | ICD-10-CM | POA: Diagnosis not present

## 2019-04-20 DIAGNOSIS — H26492 Other secondary cataract, left eye: Secondary | ICD-10-CM | POA: Diagnosis not present

## 2019-05-25 DIAGNOSIS — Z961 Presence of intraocular lens: Secondary | ICD-10-CM | POA: Diagnosis not present

## 2019-07-05 DIAGNOSIS — S99921A Unspecified injury of right foot, initial encounter: Secondary | ICD-10-CM | POA: Diagnosis not present

## 2019-07-05 DIAGNOSIS — S92334A Nondisplaced fracture of third metatarsal bone, right foot, initial encounter for closed fracture: Secondary | ICD-10-CM | POA: Diagnosis not present

## 2019-07-06 DIAGNOSIS — S92344A Nondisplaced fracture of fourth metatarsal bone, right foot, initial encounter for closed fracture: Secondary | ICD-10-CM | POA: Diagnosis not present

## 2019-07-06 DIAGNOSIS — S93601A Unspecified sprain of right foot, initial encounter: Secondary | ICD-10-CM | POA: Diagnosis not present

## 2019-07-09 IMAGING — MR MR LUMBAR SPINE W/O CM
4 of 5 series · 27 of 48 positions shown · non-contrast
Comparison: None.

CLINICAL DATA: Three day history of bilateral low back pain with
radiation to both legs with numbness.

EXAM:
MRI LUMBAR SPINE WITHOUT CONTRAST
TECHNIQUE: Multiplanar, multisequence MR imaging of the lumbar spine was
performed. No intravenous contrast was administered.

[Series 5: T2 · sagittal · 4.0mm · 0.68mm/px · 7 of 16 slices shown (1 of 2)]
[im 1/16]
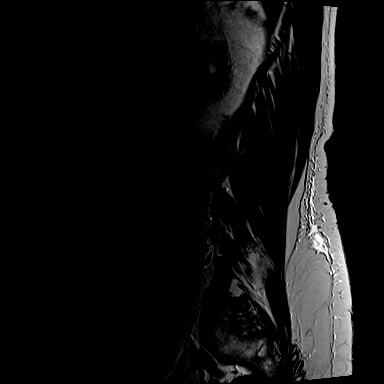
[im 3/16]
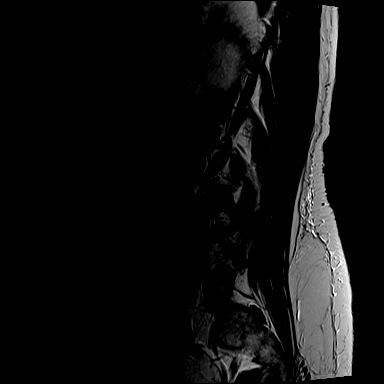
[im 6/16]
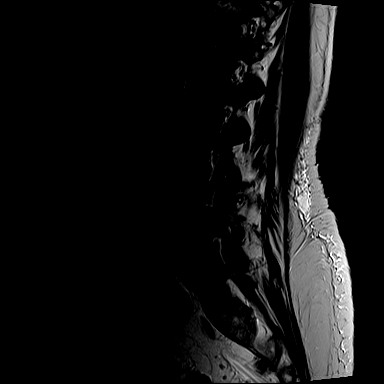
[im 8/16]
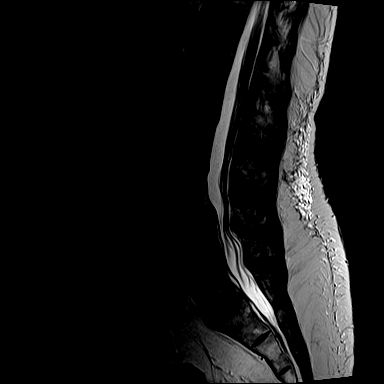
[im 11/16]
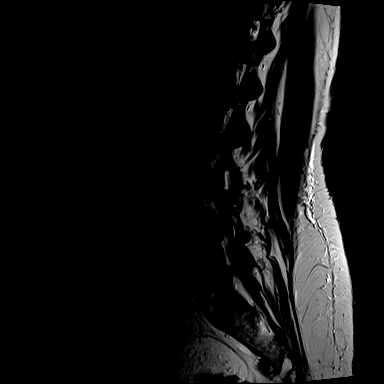
[im 13/16]
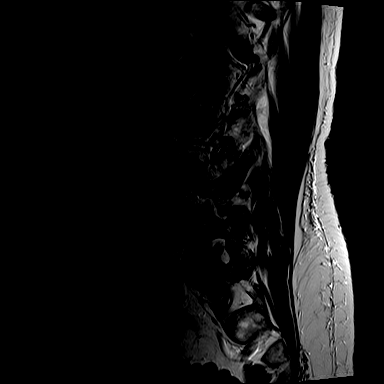
[im 16/16]
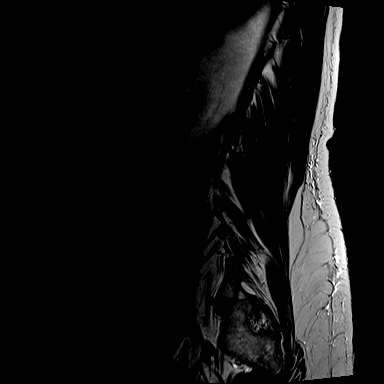

[Series 7: T1 · sagittal · 4.0mm · 0.88mm/px · 7 of 16 slices shown (1 of 2)]
[im 1/16]
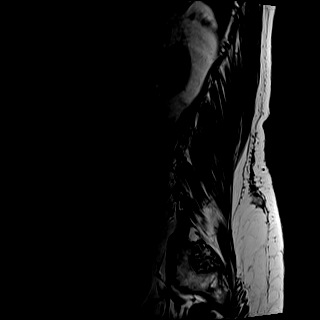
[im 3/16]
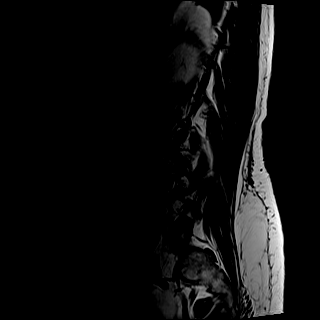
[im 6/16]
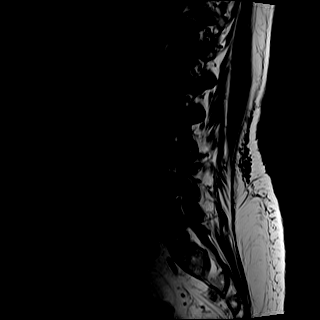
[im 8/16]
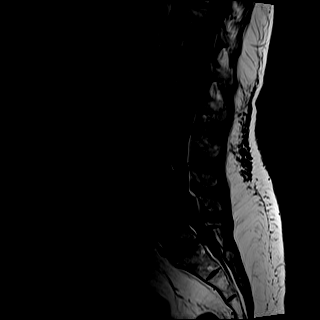
[im 11/16]
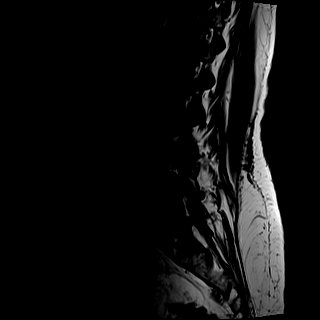
[im 13/16]
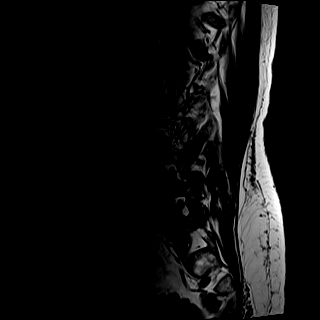
[im 16/16]
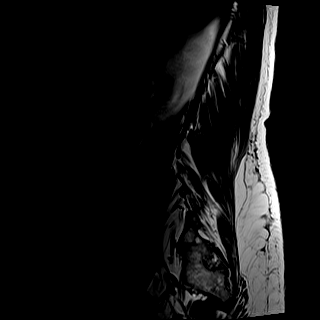

[Series 8: T2 · axial · 4.0mm · 0.57mm/px · z∈[-146,+37]mm · 8 of 33 slices shown (2 of 2)]
[im 1/33]
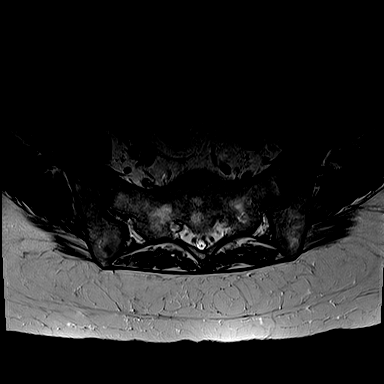
[im 5/33]
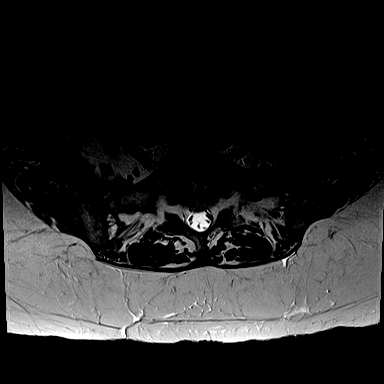
[im 10/33]
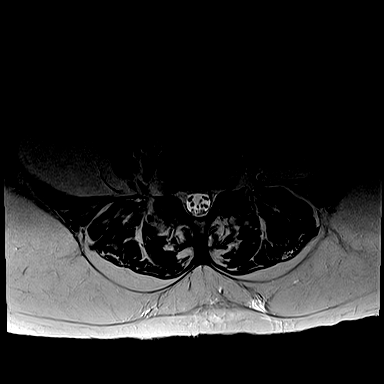
[im 15/33]
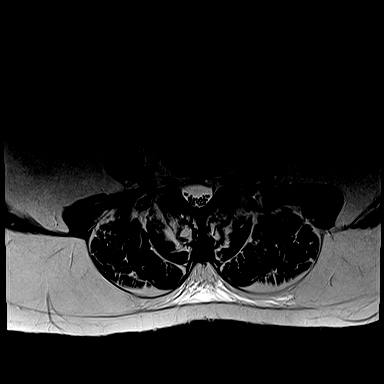
[im 18/33]
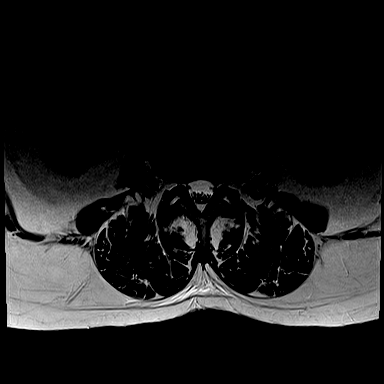
[im 23/33]
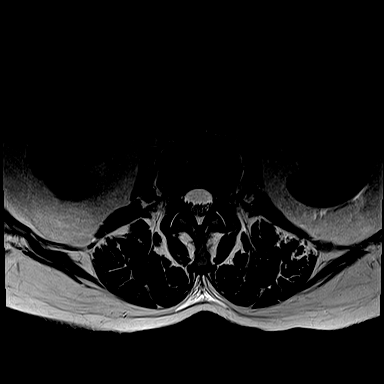
[im 28/33]
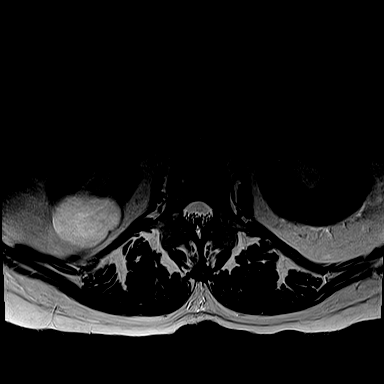
[im 33/33]
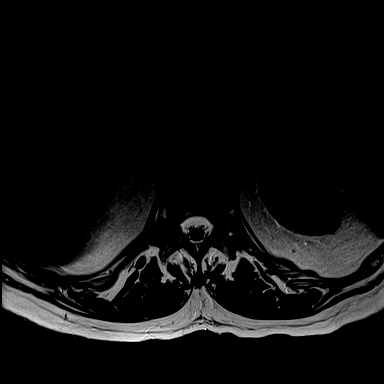

[Series 9: T1 · axial · 4.0mm · 0.34mm/px · z∈[-140,+12]mm · 5 of 31 slices shown (2 of 2)]
[im 1/31]
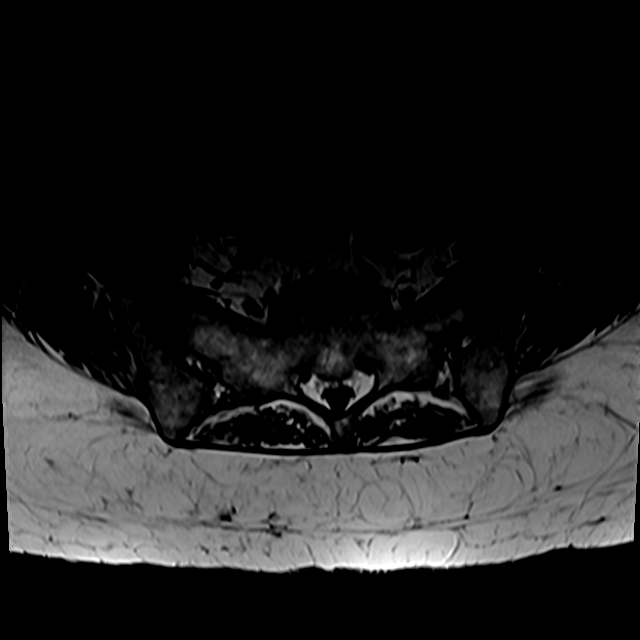
[im 6/31]
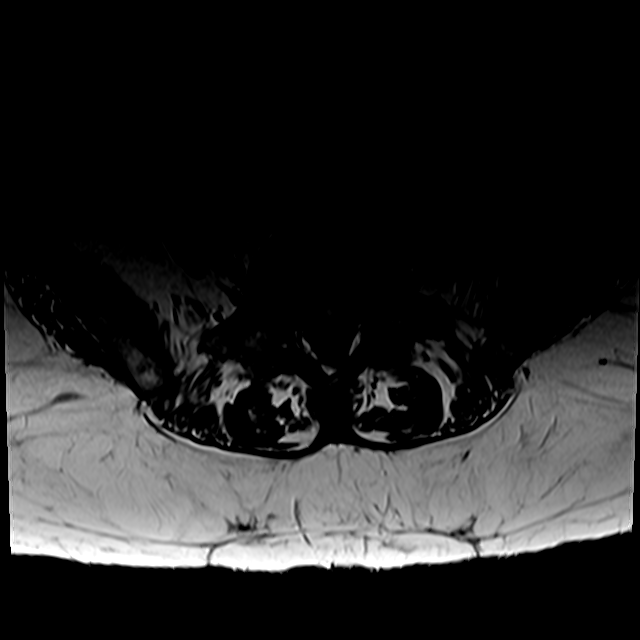
[im 11/31]
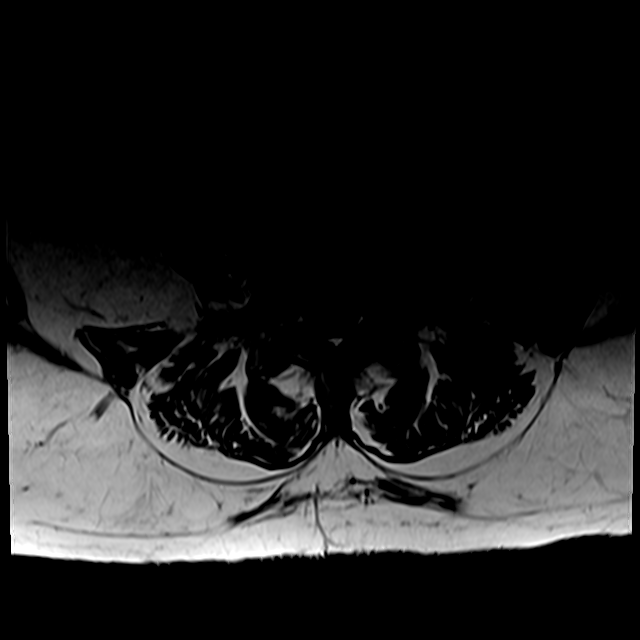
[im 16/31]
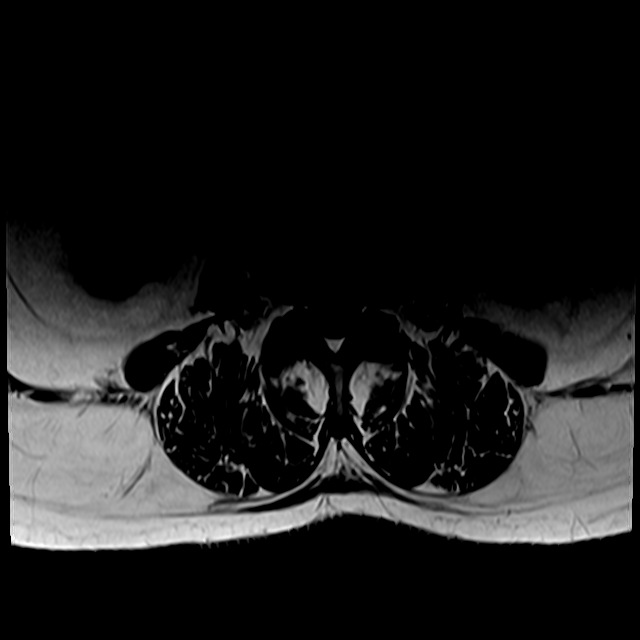
[im 26/31]
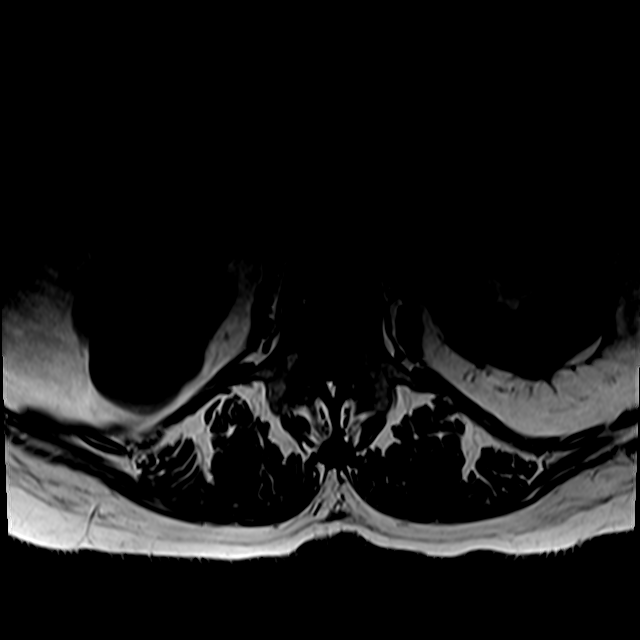

[27 of 48 positions shown; findings below may reference images not displayed]

FINDINGS: Segmentation: S1 is a transitional vertebra. Correlation with this
numbering scheme would be important should intervention be
contemplated.

Alignment: Degenerative anterolisthesis at L4-5 of 8 mm. Otherwise
normal.

Vertebrae:  No fracture or primary bone lesion.

Conus medullaris and cauda equina: Conus extends to the L2 level.
Conus and cauda equina appear normal.

Paraspinal and other soft tissues: Septated right renal cyst, not
primarily or completely evaluated.

Disc levels:

No abnormality at T12-L1, L1-2 or L2-3.

L3-4: Bilateral facet arthropathy with gaping, fluid-filled facet
joints. There is bulging of the disc. There is a left foraminal to
extraforaminal disc herniation that could focally compress the left
L3 nerve. This appearance could worsen with standing or flexion,
when anterolisthesis would likely occur.

L4-5: Chronic facet arthropathy with 8 mm of anterolisthesis.
Circumferential protrusion of the disc. Stenosis of the lateral
recesses and foramina that could be symptomatic. This appearance
could worsen with standing or flexion. Discogenic edema of the
endplates at this level could be associated with low back pain.

L5-S1: Minimal disc bulge. Mild facet osteoarthritis. No canal or
foraminal narrowing.

S1-2: Unremarkable.
IMPRESSION: S1 is described as a transitional vertebra.

L3-4: Bilateral facet arthropathy with gaping, fluid-filled facet
joints. Left foraminal to extraforaminal disc herniation likely to
compress the left L3 nerve. The appearance at this level could
worsen with standing or flexion, when anterolisthesis would be
likely to occur.

L4-5: Chronic bilateral facet arthropathy with 8 mm of
anterolisthesis. Shallow protrusion of the disc. Narrowing of the
lateral recesses and neural foramina that could cause neural
compression. This appearance could worsen with standing or flexion.
Discogenic edema of the endplates at this level could be associated
with low back pain.

## 2019-07-13 DIAGNOSIS — S92344D Nondisplaced fracture of fourth metatarsal bone, right foot, subsequent encounter for fracture with routine healing: Secondary | ICD-10-CM | POA: Diagnosis not present

## 2019-07-19 DIAGNOSIS — J449 Chronic obstructive pulmonary disease, unspecified: Secondary | ICD-10-CM | POA: Diagnosis not present

## 2019-07-27 DIAGNOSIS — F331 Major depressive disorder, recurrent, moderate: Secondary | ICD-10-CM | POA: Diagnosis not present

## 2019-07-27 DIAGNOSIS — Z79899 Other long term (current) drug therapy: Secondary | ICD-10-CM | POA: Diagnosis not present

## 2019-07-27 DIAGNOSIS — F411 Generalized anxiety disorder: Secondary | ICD-10-CM | POA: Diagnosis not present

## 2019-08-09 DIAGNOSIS — B370 Candidal stomatitis: Secondary | ICD-10-CM | POA: Diagnosis not present

## 2019-08-09 DIAGNOSIS — I959 Hypotension, unspecified: Secondary | ICD-10-CM | POA: Diagnosis not present

## 2019-08-09 DIAGNOSIS — R05 Cough: Secondary | ICD-10-CM | POA: Diagnosis not present

## 2019-08-09 DIAGNOSIS — M5134 Other intervertebral disc degeneration, thoracic region: Secondary | ICD-10-CM | POA: Diagnosis not present

## 2019-08-10 DIAGNOSIS — S92344D Nondisplaced fracture of fourth metatarsal bone, right foot, subsequent encounter for fracture with routine healing: Secondary | ICD-10-CM | POA: Diagnosis not present

## 2019-08-13 DIAGNOSIS — E876 Hypokalemia: Secondary | ICD-10-CM | POA: Diagnosis not present

## 2019-08-14 DIAGNOSIS — E860 Dehydration: Secondary | ICD-10-CM | POA: Diagnosis not present

## 2019-08-14 DIAGNOSIS — J84114 Acute interstitial pneumonitis: Secondary | ICD-10-CM | POA: Diagnosis not present

## 2019-08-14 DIAGNOSIS — B370 Candidal stomatitis: Secondary | ICD-10-CM | POA: Diagnosis not present

## 2019-08-14 DIAGNOSIS — J440 Chronic obstructive pulmonary disease with acute lower respiratory infection: Secondary | ICD-10-CM | POA: Diagnosis not present

## 2019-08-26 DIAGNOSIS — R05 Cough: Secondary | ICD-10-CM | POA: Diagnosis not present

## 2019-08-26 DIAGNOSIS — J189 Pneumonia, unspecified organism: Secondary | ICD-10-CM | POA: Diagnosis not present

## 2019-08-27 DIAGNOSIS — M19011 Primary osteoarthritis, right shoulder: Secondary | ICD-10-CM | POA: Diagnosis not present

## 2019-08-27 DIAGNOSIS — M7582 Other shoulder lesions, left shoulder: Secondary | ICD-10-CM | POA: Diagnosis not present

## 2019-08-27 DIAGNOSIS — M19012 Primary osteoarthritis, left shoulder: Secondary | ICD-10-CM | POA: Diagnosis not present

## 2019-08-27 DIAGNOSIS — M7581 Other shoulder lesions, right shoulder: Secondary | ICD-10-CM | POA: Diagnosis not present

## 2019-09-07 DIAGNOSIS — M7582 Other shoulder lesions, left shoulder: Secondary | ICD-10-CM | POA: Diagnosis not present

## 2019-09-07 DIAGNOSIS — S93601D Unspecified sprain of right foot, subsequent encounter: Secondary | ICD-10-CM | POA: Diagnosis not present

## 2019-09-07 DIAGNOSIS — M7581 Other shoulder lesions, right shoulder: Secondary | ICD-10-CM | POA: Diagnosis not present

## 2019-09-07 DIAGNOSIS — M19011 Primary osteoarthritis, right shoulder: Secondary | ICD-10-CM | POA: Diagnosis not present

## 2019-09-07 DIAGNOSIS — S92344D Nondisplaced fracture of fourth metatarsal bone, right foot, subsequent encounter for fracture with routine healing: Secondary | ICD-10-CM | POA: Diagnosis not present

## 2019-09-07 DIAGNOSIS — M5441 Lumbago with sciatica, right side: Secondary | ICD-10-CM | POA: Diagnosis not present

## 2019-09-07 DIAGNOSIS — I10 Essential (primary) hypertension: Secondary | ICD-10-CM | POA: Diagnosis not present

## 2019-09-07 DIAGNOSIS — J449 Chronic obstructive pulmonary disease, unspecified: Secondary | ICD-10-CM | POA: Diagnosis not present

## 2019-09-07 DIAGNOSIS — M5442 Lumbago with sciatica, left side: Secondary | ICD-10-CM | POA: Diagnosis not present

## 2019-09-07 DIAGNOSIS — S92324D Nondisplaced fracture of second metatarsal bone, right foot, subsequent encounter for fracture with routine healing: Secondary | ICD-10-CM | POA: Diagnosis not present

## 2019-09-07 DIAGNOSIS — E7800 Pure hypercholesterolemia, unspecified: Secondary | ICD-10-CM | POA: Diagnosis not present

## 2019-09-07 DIAGNOSIS — S92341D Displaced fracture of fourth metatarsal bone, right foot, subsequent encounter for fracture with routine healing: Secondary | ICD-10-CM | POA: Diagnosis not present

## 2019-09-07 DIAGNOSIS — G8929 Other chronic pain: Secondary | ICD-10-CM | POA: Diagnosis not present

## 2019-09-07 DIAGNOSIS — M7541 Impingement syndrome of right shoulder: Secondary | ICD-10-CM | POA: Diagnosis not present

## 2019-09-07 DIAGNOSIS — M7542 Impingement syndrome of left shoulder: Secondary | ICD-10-CM | POA: Diagnosis not present

## 2019-09-07 DIAGNOSIS — S92334D Nondisplaced fracture of third metatarsal bone, right foot, subsequent encounter for fracture with routine healing: Secondary | ICD-10-CM | POA: Diagnosis not present

## 2019-09-07 DIAGNOSIS — M19012 Primary osteoarthritis, left shoulder: Secondary | ICD-10-CM | POA: Diagnosis not present

## 2019-09-07 DIAGNOSIS — R202 Paresthesia of skin: Secondary | ICD-10-CM | POA: Diagnosis not present

## 2019-09-07 DIAGNOSIS — M4316 Spondylolisthesis, lumbar region: Secondary | ICD-10-CM | POA: Diagnosis not present

## 2019-09-14 DIAGNOSIS — R7301 Impaired fasting glucose: Secondary | ICD-10-CM | POA: Diagnosis not present

## 2019-09-14 DIAGNOSIS — I10 Essential (primary) hypertension: Secondary | ICD-10-CM | POA: Diagnosis not present

## 2019-09-21 DIAGNOSIS — I700 Atherosclerosis of aorta: Secondary | ICD-10-CM | POA: Diagnosis not present

## 2019-09-21 DIAGNOSIS — J449 Chronic obstructive pulmonary disease, unspecified: Secondary | ICD-10-CM | POA: Diagnosis not present

## 2019-09-21 DIAGNOSIS — E7800 Pure hypercholesterolemia, unspecified: Secondary | ICD-10-CM | POA: Diagnosis not present

## 2019-09-21 DIAGNOSIS — M5441 Lumbago with sciatica, right side: Secondary | ICD-10-CM | POA: Diagnosis not present

## 2019-09-21 DIAGNOSIS — I10 Essential (primary) hypertension: Secondary | ICD-10-CM | POA: Diagnosis not present

## 2019-09-21 DIAGNOSIS — G8929 Other chronic pain: Secondary | ICD-10-CM | POA: Diagnosis not present

## 2019-09-21 DIAGNOSIS — R7301 Impaired fasting glucose: Secondary | ICD-10-CM | POA: Diagnosis not present

## 2019-09-21 DIAGNOSIS — M5442 Lumbago with sciatica, left side: Secondary | ICD-10-CM | POA: Diagnosis not present

## 2019-10-05 DIAGNOSIS — J449 Chronic obstructive pulmonary disease, unspecified: Secondary | ICD-10-CM | POA: Diagnosis not present

## 2019-10-05 DIAGNOSIS — M4316 Spondylolisthesis, lumbar region: Secondary | ICD-10-CM | POA: Diagnosis not present

## 2019-10-05 DIAGNOSIS — M19012 Primary osteoarthritis, left shoulder: Secondary | ICD-10-CM | POA: Diagnosis not present

## 2019-10-05 DIAGNOSIS — R202 Paresthesia of skin: Secondary | ICD-10-CM | POA: Diagnosis not present

## 2019-10-05 DIAGNOSIS — M5441 Lumbago with sciatica, right side: Secondary | ICD-10-CM | POA: Diagnosis not present

## 2019-10-05 DIAGNOSIS — I10 Essential (primary) hypertension: Secondary | ICD-10-CM | POA: Diagnosis not present

## 2019-10-05 DIAGNOSIS — G8929 Other chronic pain: Secondary | ICD-10-CM | POA: Diagnosis not present

## 2019-10-05 DIAGNOSIS — M19011 Primary osteoarthritis, right shoulder: Secondary | ICD-10-CM | POA: Diagnosis not present

## 2019-10-05 DIAGNOSIS — M7582 Other shoulder lesions, left shoulder: Secondary | ICD-10-CM | POA: Diagnosis not present

## 2019-10-05 DIAGNOSIS — M5442 Lumbago with sciatica, left side: Secondary | ICD-10-CM | POA: Diagnosis not present

## 2019-10-05 DIAGNOSIS — S92341D Displaced fracture of fourth metatarsal bone, right foot, subsequent encounter for fracture with routine healing: Secondary | ICD-10-CM | POA: Diagnosis not present

## 2019-10-05 DIAGNOSIS — M7581 Other shoulder lesions, right shoulder: Secondary | ICD-10-CM | POA: Diagnosis not present

## 2019-10-05 DIAGNOSIS — E7800 Pure hypercholesterolemia, unspecified: Secondary | ICD-10-CM | POA: Diagnosis not present

## 2019-10-13 DIAGNOSIS — M19012 Primary osteoarthritis, left shoulder: Secondary | ICD-10-CM | POA: Diagnosis not present

## 2019-10-13 DIAGNOSIS — M25511 Pain in right shoulder: Secondary | ICD-10-CM | POA: Diagnosis not present

## 2019-10-13 DIAGNOSIS — M19011 Primary osteoarthritis, right shoulder: Secondary | ICD-10-CM | POA: Diagnosis not present

## 2019-10-13 DIAGNOSIS — M7551 Bursitis of right shoulder: Secondary | ICD-10-CM | POA: Diagnosis not present

## 2019-10-13 DIAGNOSIS — M7541 Impingement syndrome of right shoulder: Secondary | ICD-10-CM | POA: Diagnosis not present

## 2019-10-15 DIAGNOSIS — F411 Generalized anxiety disorder: Secondary | ICD-10-CM | POA: Diagnosis not present

## 2019-10-15 DIAGNOSIS — F330 Major depressive disorder, recurrent, mild: Secondary | ICD-10-CM | POA: Diagnosis not present

## 2019-10-15 DIAGNOSIS — Z79899 Other long term (current) drug therapy: Secondary | ICD-10-CM | POA: Diagnosis not present

## 2019-10-18 DIAGNOSIS — S92344D Nondisplaced fracture of fourth metatarsal bone, right foot, subsequent encounter for fracture with routine healing: Secondary | ICD-10-CM | POA: Diagnosis not present

## 2019-10-18 DIAGNOSIS — M7541 Impingement syndrome of right shoulder: Secondary | ICD-10-CM | POA: Diagnosis not present

## 2019-10-18 DIAGNOSIS — M7542 Impingement syndrome of left shoulder: Secondary | ICD-10-CM | POA: Diagnosis not present

## 2019-10-18 DIAGNOSIS — S93601D Unspecified sprain of right foot, subsequent encounter: Secondary | ICD-10-CM | POA: Diagnosis not present

## 2019-11-15 DIAGNOSIS — F330 Major depressive disorder, recurrent, mild: Secondary | ICD-10-CM | POA: Diagnosis not present

## 2019-11-15 DIAGNOSIS — F411 Generalized anxiety disorder: Secondary | ICD-10-CM | POA: Diagnosis not present

## 2019-11-23 DIAGNOSIS — M7541 Impingement syndrome of right shoulder: Secondary | ICD-10-CM | POA: Diagnosis not present

## 2019-12-03 DIAGNOSIS — M7541 Impingement syndrome of right shoulder: Secondary | ICD-10-CM | POA: Diagnosis not present

## 2019-12-03 DIAGNOSIS — S43431A Superior glenoid labrum lesion of right shoulder, initial encounter: Secondary | ICD-10-CM | POA: Diagnosis not present

## 2019-12-03 DIAGNOSIS — M75111 Incomplete rotator cuff tear or rupture of right shoulder, not specified as traumatic: Secondary | ICD-10-CM | POA: Diagnosis not present

## 2019-12-03 DIAGNOSIS — M75121 Complete rotator cuff tear or rupture of right shoulder, not specified as traumatic: Secondary | ICD-10-CM | POA: Diagnosis not present

## 2019-12-03 DIAGNOSIS — M94211 Chondromalacia, right shoulder: Secondary | ICD-10-CM | POA: Diagnosis not present

## 2019-12-03 DIAGNOSIS — G8918 Other acute postprocedural pain: Secondary | ICD-10-CM | POA: Diagnosis not present

## 2019-12-03 DIAGNOSIS — M19011 Primary osteoarthritis, right shoulder: Secondary | ICD-10-CM | POA: Diagnosis not present

## 2019-12-03 DIAGNOSIS — M24111 Other articular cartilage disorders, right shoulder: Secondary | ICD-10-CM | POA: Diagnosis not present

## 2019-12-18 DIAGNOSIS — R112 Nausea with vomiting, unspecified: Secondary | ICD-10-CM | POA: Diagnosis not present

## 2019-12-18 DIAGNOSIS — R5383 Other fatigue: Secondary | ICD-10-CM | POA: Diagnosis not present

## 2019-12-18 DIAGNOSIS — F419 Anxiety disorder, unspecified: Secondary | ICD-10-CM | POA: Diagnosis not present

## 2020-01-04 DIAGNOSIS — M25611 Stiffness of right shoulder, not elsewhere classified: Secondary | ICD-10-CM | POA: Diagnosis not present

## 2020-01-04 DIAGNOSIS — M6281 Muscle weakness (generalized): Secondary | ICD-10-CM | POA: Diagnosis not present

## 2020-01-04 DIAGNOSIS — Z4789 Encounter for other orthopedic aftercare: Secondary | ICD-10-CM | POA: Diagnosis not present

## 2020-01-05 DIAGNOSIS — J432 Centrilobular emphysema: Secondary | ICD-10-CM | POA: Diagnosis not present

## 2020-01-12 DIAGNOSIS — E7800 Pure hypercholesterolemia, unspecified: Secondary | ICD-10-CM | POA: Diagnosis not present

## 2020-01-12 DIAGNOSIS — M7581 Other shoulder lesions, right shoulder: Secondary | ICD-10-CM | POA: Diagnosis not present

## 2020-01-12 DIAGNOSIS — F330 Major depressive disorder, recurrent, mild: Secondary | ICD-10-CM | POA: Diagnosis not present

## 2020-01-12 DIAGNOSIS — M19011 Primary osteoarthritis, right shoulder: Secondary | ICD-10-CM | POA: Diagnosis not present

## 2020-01-12 DIAGNOSIS — F411 Generalized anxiety disorder: Secondary | ICD-10-CM | POA: Diagnosis not present

## 2020-01-12 DIAGNOSIS — Z7689 Persons encountering health services in other specified circumstances: Secondary | ICD-10-CM | POA: Diagnosis not present

## 2020-01-12 DIAGNOSIS — J432 Centrilobular emphysema: Secondary | ICD-10-CM | POA: Diagnosis not present

## 2020-01-12 DIAGNOSIS — Z23 Encounter for immunization: Secondary | ICD-10-CM | POA: Diagnosis not present

## 2020-01-12 DIAGNOSIS — M8580 Other specified disorders of bone density and structure, unspecified site: Secondary | ICD-10-CM | POA: Diagnosis not present

## 2020-01-12 DIAGNOSIS — N1831 Chronic kidney disease, stage 3a: Secondary | ICD-10-CM | POA: Diagnosis not present

## 2020-01-12 DIAGNOSIS — R7303 Prediabetes: Secondary | ICD-10-CM | POA: Diagnosis not present

## 2020-01-12 DIAGNOSIS — M7582 Other shoulder lesions, left shoulder: Secondary | ICD-10-CM | POA: Diagnosis not present

## 2020-01-12 DIAGNOSIS — I129 Hypertensive chronic kidney disease with stage 1 through stage 4 chronic kidney disease, or unspecified chronic kidney disease: Secondary | ICD-10-CM | POA: Diagnosis not present

## 2020-01-17 DIAGNOSIS — M6281 Muscle weakness (generalized): Secondary | ICD-10-CM | POA: Diagnosis not present

## 2020-01-17 DIAGNOSIS — M25611 Stiffness of right shoulder, not elsewhere classified: Secondary | ICD-10-CM | POA: Diagnosis not present

## 2020-01-17 DIAGNOSIS — Z4789 Encounter for other orthopedic aftercare: Secondary | ICD-10-CM | POA: Diagnosis not present

## 2020-02-07 DIAGNOSIS — M25611 Stiffness of right shoulder, not elsewhere classified: Secondary | ICD-10-CM | POA: Diagnosis not present

## 2020-02-07 DIAGNOSIS — Z4789 Encounter for other orthopedic aftercare: Secondary | ICD-10-CM | POA: Diagnosis not present

## 2020-02-07 DIAGNOSIS — M6281 Muscle weakness (generalized): Secondary | ICD-10-CM | POA: Diagnosis not present

## 2020-02-07 DIAGNOSIS — R29898 Other symptoms and signs involving the musculoskeletal system: Secondary | ICD-10-CM | POA: Diagnosis not present

## 2020-02-07 DIAGNOSIS — Z7409 Other reduced mobility: Secondary | ICD-10-CM | POA: Diagnosis not present

## 2020-03-06 DIAGNOSIS — Z4789 Encounter for other orthopedic aftercare: Secondary | ICD-10-CM | POA: Diagnosis not present

## 2020-03-06 DIAGNOSIS — M25611 Stiffness of right shoulder, not elsewhere classified: Secondary | ICD-10-CM | POA: Diagnosis not present

## 2020-03-06 DIAGNOSIS — M6281 Muscle weakness (generalized): Secondary | ICD-10-CM | POA: Diagnosis not present

## 2020-03-06 DIAGNOSIS — Z7409 Other reduced mobility: Secondary | ICD-10-CM | POA: Diagnosis not present

## 2020-03-09 DIAGNOSIS — Z4789 Encounter for other orthopedic aftercare: Secondary | ICD-10-CM | POA: Diagnosis not present

## 2020-03-09 DIAGNOSIS — N1831 Chronic kidney disease, stage 3a: Secondary | ICD-10-CM | POA: Diagnosis not present

## 2020-03-09 DIAGNOSIS — E7800 Pure hypercholesterolemia, unspecified: Secondary | ICD-10-CM | POA: Diagnosis not present

## 2020-03-09 DIAGNOSIS — R7303 Prediabetes: Secondary | ICD-10-CM | POA: Diagnosis not present

## 2020-03-09 DIAGNOSIS — I10 Essential (primary) hypertension: Secondary | ICD-10-CM | POA: Diagnosis not present

## 2020-03-14 DIAGNOSIS — F17210 Nicotine dependence, cigarettes, uncomplicated: Secondary | ICD-10-CM | POA: Diagnosis not present

## 2020-03-14 DIAGNOSIS — J432 Centrilobular emphysema: Secondary | ICD-10-CM | POA: Diagnosis not present

## 2020-04-04 DIAGNOSIS — F411 Generalized anxiety disorder: Secondary | ICD-10-CM | POA: Diagnosis not present

## 2020-04-04 DIAGNOSIS — F330 Major depressive disorder, recurrent, mild: Secondary | ICD-10-CM | POA: Diagnosis not present

## 2020-04-13 DIAGNOSIS — M75101 Unspecified rotator cuff tear or rupture of right shoulder, not specified as traumatic: Secondary | ICD-10-CM | POA: Diagnosis not present

## 2020-04-13 DIAGNOSIS — M79671 Pain in right foot: Secondary | ICD-10-CM | POA: Diagnosis not present

## 2020-04-13 DIAGNOSIS — Z4789 Encounter for other orthopedic aftercare: Secondary | ICD-10-CM | POA: Diagnosis not present

## 2020-05-02 DIAGNOSIS — Z79899 Other long term (current) drug therapy: Secondary | ICD-10-CM | POA: Diagnosis not present

## 2020-05-02 DIAGNOSIS — F411 Generalized anxiety disorder: Secondary | ICD-10-CM | POA: Diagnosis not present

## 2020-05-02 DIAGNOSIS — F331 Major depressive disorder, recurrent, moderate: Secondary | ICD-10-CM | POA: Diagnosis not present
